# Patient Record
Sex: Female | Born: 2000 | Race: Black or African American | Hispanic: No | Marital: Single | Smoking: Never smoker
Health system: Southern US, Community
[De-identification: ages and names within clinical notes are randomized; demographics above are authoritative.]

---
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
## 2018-04-13 ENCOUNTER — Encounter: Payer: Self-pay | Admitting: Family Medicine

## 2018-04-13 ENCOUNTER — Ambulatory Visit (INDEPENDENT_AMBULATORY_CARE_PROVIDER_SITE_OTHER): Payer: BLUE CROSS/BLUE SHIELD | Admitting: Family Medicine

## 2018-04-13 ENCOUNTER — Other Ambulatory Visit: Payer: Self-pay

## 2018-04-13 VITALS — BP 122/82 | HR 62 | Temp 98.0°F | Resp 16 | Ht 72.44 in | Wt 227.0 lb

## 2018-04-13 DIAGNOSIS — R0981 Nasal congestion: Secondary | ICD-10-CM | POA: Diagnosis not present

## 2018-04-13 DIAGNOSIS — L700 Acne vulgaris: Secondary | ICD-10-CM | POA: Diagnosis not present

## 2018-04-13 DIAGNOSIS — R059 Cough, unspecified: Secondary | ICD-10-CM

## 2018-04-13 DIAGNOSIS — R05 Cough: Secondary | ICD-10-CM

## 2018-04-13 MED ORDER — DOXYCYCLINE HYCLATE 100 MG PO TABS: 100.0000 mg | ORAL_TABLET | Freq: Two times a day (BID) | ORAL | 0 refills | Status: DC

## 2018-04-13 NOTE — Patient Instructions (Addendum)
For cough and nasal congestion, start Flonase nasal spray and antibiotic twice per day.  That antibiotic will also help acne at times.  However I would like you to follow-up with your list of previous medications for acne so we can determine the next step.  See information below.  Let me know if you have any questions in the meantime, and if cough is not improving in the next 2 weeks with current treatment, please follow-up. Return to the clinic or go to the nearest emergency room if any of your symptoms worsen or new symptoms occur.   Acne Acne is a skin problem that causes pimples. Acne occurs when the pores in the skin get blocked. The pores may become infected with bacteria, or they may become red, sore, and swollen. Acne is a common skin problem, especially for teenagers. Acne usually goes away over time. What are the causes? Each pore contains an oil gland. Oil glands make an oily substance that is called sebum. Acne happens when these glands get plugged with sebum, dead skin cells, and dirt. Then, the bacteria that are normally found in the oil glands multiply and cause inflammation. Acne is commonly triggered by changes in your hormones. These hormonal changes can cause the oil glands to get bigger and to make more sebum. Factors that can make acne worse include:  Hormone changes during: ? Adolescence. ? Women's menstrual cycles. ? Pregnancy.  Oil-based cosmetics and hair products.  Harshly scrubbing the skin.  Strong soaps.  Stress.  Hormone problems that are due to certain diseases.  Long or oily hair rubbing against the skin.  Certain medicines.  Pressure from headbands, backpacks, or shoulder pads.  Exposure to certain oils and chemicals.  What increases the risk? This condition is more likely to develop in:  Teenagers.  People who have a family history of acne.  What are the signs or symptoms? Acne often occurs on the face, neck, chest, and upper back. Symptoms  include:  Small, red bumps (pimples or papules).  Whiteheads.  Blackheads.  Small, pus-filled pimples (pustules).  Big, red pimples or pustules that feel tender.  More severe acne can cause:  An infected area that contains a collection of pus (abscess).  Hard, painful, fluid-filled sacs (cysts).  Scars.  How is this diagnosed? This condition is diagnosed with a medical history and physical exam. Blood tests may also be done. How is this treated? Treatment for this condition can vary depending on the severity of your acne. Treatment may include:  Creams and lotions that prevent oil glands from clogging.  Creams and lotions that treat or prevent infections and inflammation.  Antibiotic medicines that are applied to the skin or taken as a pill.  Pills that decrease sebum production.  Birth control pills.  Light or laser treatments.  Surgery.  Injections of medicine into the affected areas.  Chemicals that cause peeling of the skin.  Your health care provider will also recommend the best way to take care of your skin. Good skin care is the most important part of treatment. Follow these instructions at home: Skin care Take care of your skin as told by your health care provider. You may be told to do these things:  Wash your skin gently at least two times each day, as well as: ? After you exercise. ? Before you go to bed.  Use mild soap.  Apply a water-based skin moisturizer after you wash your skin.  Use a sunscreen or sunblock with  SPF 29 or greater. This is especially important if you are using acne medicines.  Choose cosmetics that will not plug your oil glands (are noncomedogenic).  Medicines  Take over-the-counter and prescription medicines only as told by your health care provider.  If you were prescribed an antibiotic medicine, apply or take it as told by your health care provider. Do not stop taking the antibiotic even if your condition  improves. General instructions  Keep your hair clean and off of your face. If you have oily hair, shampoo your hair regularly or daily.  Avoid leaning your chin or forehead against your hands.  Avoid wearing tight headbands or hats.  Avoid picking or squeezing your pimples. That can make your acne worse and cause scarring.  Keep all follow-up visits as told by your health care provider. This is important.  Shave gently and only when necessary.  Keep a food journal to figure out if any foods are linked with your acne. Contact a health care provider if:  Your acne is not better after eight weeks.  Your acne gets worse.  You have a large area of skin that is red or tender.  You think that you are having side effects from any acne medicine. This information is not intended to replace advice given to you by your health care provider. Make sure you discuss any questions you have with your health care provider. Document Released: 12/09/2000 Document Revised: 08/12/2016 Document Reviewed: 02/18/2015 Elsevier Interactive Patient Education  2018 [place].  Cough, Adult Coughing is a reflex that clears your throat and your airways. Coughing helps to heal and protect your lungs. It is normal to cough occasionally, but a cough that happens with other symptoms or lasts a long time may be a sign of a condition that needs treatment. A cough may last only 2-3 weeks (acute), or it may last longer than 8 weeks (chronic). What are the causes? Coughing is commonly caused by:  Breathing in substances that irritate your lungs.  A viral or bacterial respiratory infection.  Allergies.  Asthma.  Postnasal drip.  Smoking.  Acid backing up from the stomach into the esophagus (gastroesophageal reflux).  Certain medicines.  Chronic lung problems, including COPD (or rarely, lung cancer).  Other medical conditions such as heart failure.  Follow these instructions at home: Pay attention to  any changes in your symptoms. Take these actions to help with your discomfort:  Take medicines only as told by your health care provider. ? If you were prescribed an antibiotic medicine, take it as told by your health care provider. Do not stop taking the antibiotic even if you start to feel better. ? Talk with your health care provider before you take a cough suppressant medicine.  Drink enough fluid to keep your urine clear or pale yellow.  If the air is dry, use a cold steam vaporizer or humidifier in your bedroom or your home to help loosen secretions.  Avoid anything that causes you to cough at work or at home.  If your cough is worse at night, try sleeping in a semi-upright position.  Avoid cigarette smoke. If you smoke, quit smoking. If you need help quitting, ask your health care provider.  Avoid caffeine.  Avoid alcohol.  Rest as needed.  Contact a health care provider if:  You have new symptoms.  You cough up pus.  Your cough does not get better after 2-3 weeks, or your cough gets worse.  You cannot control your cough  with suppressant medicines and you are losing sleep.  You develop pain that is getting worse or pain that is not controlled with pain medicines.  You have a fever.  You have unexplained weight loss.  You have night sweats. Get help right away if:  You cough up blood.  You have difficulty breathing.  Your heartbeat is very fast. This information is not intended to replace advice given to you by your health care provider. Make sure you discuss any questions you have with your health care provider. Document Released: 06/10/2011 Document Revised: 05/19/2016 Document Reviewed: 02/18/2015 Elsevier Interactive Patient Education  2018 [employer].   IF you received an x-ray today, you will receive an invoice from [facility]. Please contact [facility] at [phone] with questions or concerns regarding your invoice.   IF you  received labwork today, you will receive an invoice from [facility]. Please contact [facility] at [phone] with questions or concerns regarding your invoice.   Our billing staff will not be able to assist you with questions regarding bills from these companies.  You will be contacted with the lab results as soon as they are available. The fastest way to get your results is to activate your My Chart account. Instructions are located on the last page of this paperwork. If you have not heard from [place] regarding the results in 2 weeks, please contact this office.

## 2018-04-13 NOTE — Progress Notes (Signed)
Subjective:  By signing my name below, I, Carolyn Roman, attest that this documentation has been prepared under the direction and in the presence of Carolyn Ray, MD. Electronically Signed: Moises Roman, Rew. 04/13/2018 , 5:38 PM .  Patient was seen in Room 12 .   Patient ID: Carolyn Roman, female    DOB: 31-Oct-2001, 17 y.o.   MRN: [id number] Chief Complaint  Patient presents with  . Cough    x 3 weeks   . Acne    would like a cream to help with breakout   HPI Carolyn Roman is a 17 y.o. female  Cough Patient reports she's been having a persistent cough that started about 3 weeks ago. She felt hot to about 99*F, but no measured fevers over 100*F. She's coughed up some mucus, but no Roman. She describes her cough has been about the same, but keeps returning with some mucus. She denies wheezing or history of asthma. She's tried OTC cough syrup, cough drop, alka seltzer, and zinc- all without relief. In the beginning, she had some congestion about 2 weeks ago but this had resolved. She hasn't taking any allergy medications; no history or problems with allergy in the past. She denies history of smoking, although her roommates do vape.   Acne Patient requests information to help her acne. She reports trying multiple things in the past for acne. She recently started on an organic cream, but without relief. She's seen by dermatologist in the past, and was prescribed something that worked but stopped working; doesn't recall what it is. She informs areas affected mostly across her face, and some in her back and neck. She will bring in the medications and creams she's tried before.   Patient Active Problem List   Diagnosis Date Noted  . Left knee injury 07/09/2013  . Concussion 01/01/2013  . Forehead laceration 01/01/2013   History reviewed. No pertinent past medical history. History reviewed. No pertinent surgical history. No Known Allergies Prior to Admission medications     Medication Sig Start Date End Date Taking? Authorizing Provider  cyclobenzaprine (FLEXERIL) 5 MG tablet Take 1 tablet (5 mg total) by mouth 3 (three) times daily as needed for muscle spasms. 03/24/17   Drenda Freeze, MD  ibuprofen (ADVIL,MOTRIN) 800 MG tablet Take 1 tablet (800 mg total) by mouth 3 (three) times daily. 03/24/17   Drenda Freeze, MD   Social History   Socioeconomic History  . Marital status: Single    Spouse name: n/a  . Number of children: 0  . Years of education: Not on file  . Highest education level: Not on file  Occupational History  . Occupation: Ship broker  Social Needs  . Financial resource strain: Not on file  . Food insecurity:    Worry: Not on file    Inability: Not on file  . Transportation needs:    Medical: Not on file    Non-medical: Not on file  Tobacco Use  . Smoking status: Never Smoker  . Smokeless tobacco: Never Used  Substance and Sexual Activity  . Alcohol use: No  . Drug use: No  . Sexual activity: Never  Lifestyle  . Physical activity:    Days per week: Not on file    Minutes per session: Not on file  . Stress: Not on file  Relationships  . Social connections:    Talks on phone: Not on file    Gets together: Not on file    Attends religious service: Not  on file    Active member of club or organization: Not on file    Attends meetings of clubs or organizations: Not on file    Relationship status: Not on file  . Intimate partner violence:    Fear of current or ex partner: Not on file    Emotionally abused: Not on file    Physically abused: Not on file    Forced sexual activity: Not on file  Other Topics Concern  . Not on file  Social History Narrative   Student [employer].  Plays Basketball and [place].  Hopes to be a doctor in the orthopedic field.      Lives with mom.   Review of Systems  Constitutional: Negative for chills, fatigue, fever and unexpected weight change.  HENT: Negative for congestion.    Respiratory: Positive for cough.   Gastrointestinal: Negative for constipation, diarrhea, nausea and vomiting.  Skin: Positive for rash (facial acne). Negative for wound.  Neurological: Negative for dizziness, weakness and headaches.       Objective:   Physical Exam  Constitutional: She is oriented to person, place, and time. She appears well-developed and well-nourished. No distress.  HENT:  Head: Normocephalic and atraumatic.  Nose: Mucosal edema (bilateral) present. No rhinorrhea.  Mouth/Throat: Oropharynx is clear and moist.  Fine papular acne across her forehead and her cheeks  Eyes: Pupils are equal, round, and reactive to light. EOM are normal.  Neck: Neck supple.  Cardiovascular: Normal rate.  Pulmonary/Chest: Effort normal. No respiratory distress.  Musculoskeletal: Normal range of motion.  Neurological: She is alert and oriented to person, place, and time.  Skin: Skin is warm and dry.  Psychiatric: She has a normal mood and affect. Her behavior is normal.  Nursing note and vitals reviewed.   Vitals:   04/13/18 1708  BP: 122/82  Pulse: 62  Resp: 16  Temp: 98 F (36.7 C)  TempSrc: Oral  SpO2: 99%  Weight: 227 lb (103 kg)  Height: 6' 0.44" (1.84 m)       Assessment & Plan:    [name] is a 17 y.o. female Acne vulgaris  -Reports multiple prior different treatments, some that had lost effectiveness, but she is unsure of exact names.  Asked that she return with the list of different medications so we can most appropriately go through different options.  Can also discuss with dermatology where she was seen previously if she prefers.  Handout on acne was given.  Cough - Plan: doxycycline (VIBRA-TABS) 100 MG tablet Nasal congestion  -Persistent cough, suspect bronchitis/lower respiratory tract infection.  Start doxycycline, Flonase nasal spray for allergies, other symptomatic care and RTC precautions.  Meds ordered this encounter  Medications  .  doxycycline (VIBRA-TABS) 100 MG tablet    Sig: Take 1 tablet (100 mg total) by mouth 2 (two) times daily.    Dispense:  20 tablet    Refill:  0   Patient Instructions   For cough and nasal congestion, start Flonase nasal spray and antibiotic twice per day.  That antibiotic will also help acne at times.  However I would like you to follow-up with your list of previous medications for acne so we can determine the next step.  See information below.  Let me know if you have any questions in the meantime, and if cough is not improving in the next 2 weeks with current treatment, please follow-up. Return to the clinic or go to the nearest emergency room if any of  your symptoms worsen or new symptoms occur.   Acne Acne is a skin problem that causes pimples. Acne occurs when the pores in the skin get blocked. The pores may become infected with bacteria, or they may become red, sore, and swollen. Acne is a common skin problem, especially for teenagers. Acne usually goes away over time. What are the causes? Each pore contains an oil gland. Oil glands make an oily substance that is called sebum. Acne happens when these glands get plugged with sebum, dead skin cells, and dirt. Then, the bacteria that are normally found in the oil glands multiply and cause inflammation. Acne is commonly triggered by changes in your hormones. These hormonal changes can cause the oil glands to get bigger and to make more sebum. Factors that can make acne worse include:  Hormone changes during: ? Adolescence. ? Women's menstrual cycles. ? Pregnancy.  Oil-based cosmetics and hair products.  Harshly scrubbing the skin.  Strong soaps.  Stress.  Hormone problems that are due to certain diseases.  Long or oily hair rubbing against the skin.  Certain medicines.  Pressure from headbands, backpacks, or shoulder pads.  Exposure to certain oils and chemicals.  What increases the risk? This condition is more likely to  develop in:  Teenagers.  People who have a family history of acne.  What are the signs or symptoms? Acne often occurs on the face, neck, chest, and upper back. Symptoms include:  Small, red bumps (pimples or papules).  Whiteheads.  Blackheads.  Small, pus-filled pimples (pustules).  Big, red pimples or pustules that feel tender.  More severe acne can cause:  An infected area that contains a collection of pus (abscess).  Hard, painful, fluid-filled sacs (cysts).  Scars.  How is this diagnosed? This condition is diagnosed with a medical history and physical exam. Roman tests may also be done. How is this treated? Treatment for this condition can vary depending on the severity of your acne. Treatment may include:  Creams and lotions that prevent oil glands from clogging.  Creams and lotions that treat or prevent infections and inflammation.  Antibiotic medicines that are applied to the skin or taken as a pill.  Pills that decrease sebum production.  Birth control pills.  Light or laser treatments.  Surgery.  Injections of medicine into the affected areas.  Chemicals that cause peeling of the skin.  Your health care provider will also recommend the best way to take care of your skin. Good skin care is the most important part of treatment. Follow these instructions at home: Skin care Take care of your skin as told by your health care provider. You may be told to do these things:  Wash your skin gently at least two times each day, as well as: ? After you exercise. ? Before you go to bed.  Use mild soap.  Apply a water-based skin moisturizer after you wash your skin.  Use a sunscreen or sunblock with SPF 30 or greater. This is especially important if you are using acne medicines.  Choose cosmetics that will not plug your oil glands (are noncomedogenic).  Medicines  Take over-the-counter and prescription medicines only as told by your health care  provider.  If you were prescribed an antibiotic medicine, apply or take it as told by your health care provider. Do not stop taking the antibiotic even if your condition improves. General instructions  Keep your hair clean and off of your face. If you have oily hair, shampoo your  hair regularly or daily.  Avoid leaning your chin or forehead against your hands.  Avoid wearing tight headbands or hats.  Avoid picking or squeezing your pimples. That can make your acne worse and cause scarring.  Keep all follow-up visits as told by your health care provider. This is important.  Shave gently and only when necessary.  Keep a food journal to figure out if any foods are linked with your acne. Contact a health care provider if:  Your acne is not better after eight weeks.  Your acne gets worse.  You have a large area of skin that is red or tender.  You think that you are having side effects from any acne medicine. This information is not intended to replace advice given to you by your health care provider. Make sure you discuss any questions you have with your health care provider. Document Released: 12/09/2000 Document Revised: 08/12/2016 Document Reviewed: 02/18/2015 Elsevier Interactive Patient Education  2018 [employer].  Cough, Adult Coughing is a reflex that clears your throat and your airways. Coughing helps to heal and protect your lungs. It is normal to cough occasionally, but a cough that happens with other symptoms or lasts a long time may be a sign of a condition that needs treatment. A cough may last only 2-3 weeks (acute), or it may last longer than 8 weeks (chronic). What are the causes? Coughing is commonly caused by:  Breathing in substances that irritate your lungs.  A viral or bacterial respiratory infection.  Allergies.  Asthma.  Postnasal drip.  Smoking.  Acid backing up from the stomach into the esophagus (gastroesophageal reflux).  Certain  medicines.  Chronic lung problems, including COPD (or rarely, lung cancer).  Other medical conditions such as heart failure.  Follow these instructions at home: Pay attention to any changes in your symptoms. Take these actions to help with your discomfort:  Take medicines only as told by your health care provider. ? If you were prescribed an antibiotic medicine, take it as told by your health care provider. Do not stop taking the antibiotic even if you start to feel better. ? Talk with your health care provider before you take a cough suppressant medicine.  Drink enough fluid to keep your urine clear or pale yellow.  If the air is dry, use a cold steam vaporizer or humidifier in your bedroom or your home to help loosen secretions.  Avoid anything that causes you to cough at work or at home.  If your cough is worse at night, try sleeping in a semi-upright position.  Avoid cigarette smoke. If you smoke, quit smoking. If you need help quitting, ask your health care provider.  Avoid caffeine.  Avoid alcohol.  Rest as needed.  Contact a health care provider if:  You have new symptoms.  You cough up pus.  Your cough does not get better after 2-3 weeks, or your cough gets worse.  You cannot control your cough with suppressant medicines and you are losing sleep.  You develop pain that is getting worse or pain that is not controlled with pain medicines.  You have a fever.  You have unexplained weight loss.  You have night sweats. Get help right away if:  You cough up Roman.  You have difficulty breathing.  Your heartbeat is very fast. This information is not intended to replace advice given to you by your health care provider. Make sure you discuss any questions you have with your health care provider. Document  Released: 06/10/2011 Document Revised: 05/19/2016 Document Reviewed: 02/18/2015 Elsevier Interactive Patient Education  2018 [employer].   IF you received  an x-Roman today, you will receive an invoice from [facility]. Please contact [facility] at [phone] with questions or concerns regarding your invoice.   IF you received labwork today, you will receive an invoice from [facility]. Please contact [facility] at [phone] with questions or concerns regarding your invoice.   Our billing staff will not be able to assist you with questions regarding bills from these companies.  You will be contacted with the lab results as soon as they are available. The fastest way to get your results is to activate your My Chart account. Instructions are located on the last page of this paperwork. If you have not heard from [place] regarding the results in 2 weeks, please contact this office.        I personally performed the services described in this documentation, which was scribed in my presence. The recorded information has been reviewed and considered for accuracy and completeness, addended by me as needed, and agree with information above.  Signed,   Carolyn Ray, MD [facility].  04/15/18 4:08 PM

## 2018-06-07 IMAGING — DX DG CERVICAL SPINE COMPLETE 4+V
5 series · 5 of 5 positions shown · non-contrast
Comparison: None.

CLINICAL DATA: Neck pain and left arm numbness after stretching
today

EXAM:
CERVICAL SPINE - COMPLETE 4+ VIEW

[w cervical spine lat]
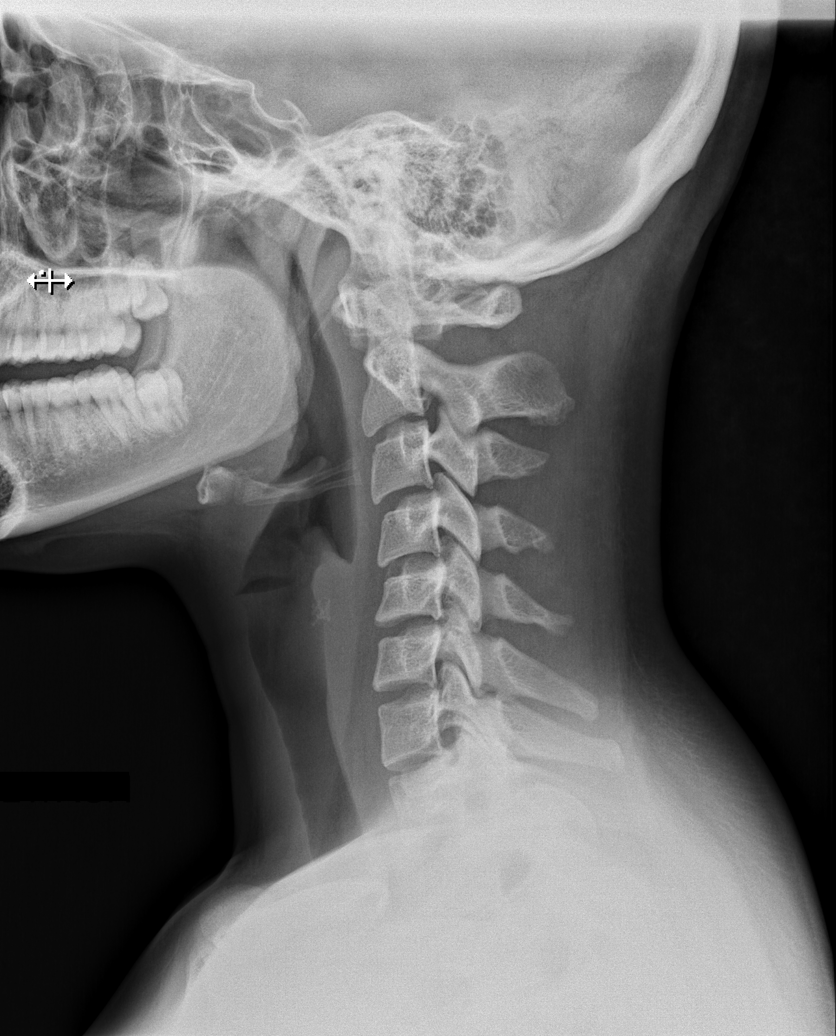

[w cervical spine ap_obl (1 of 2)]
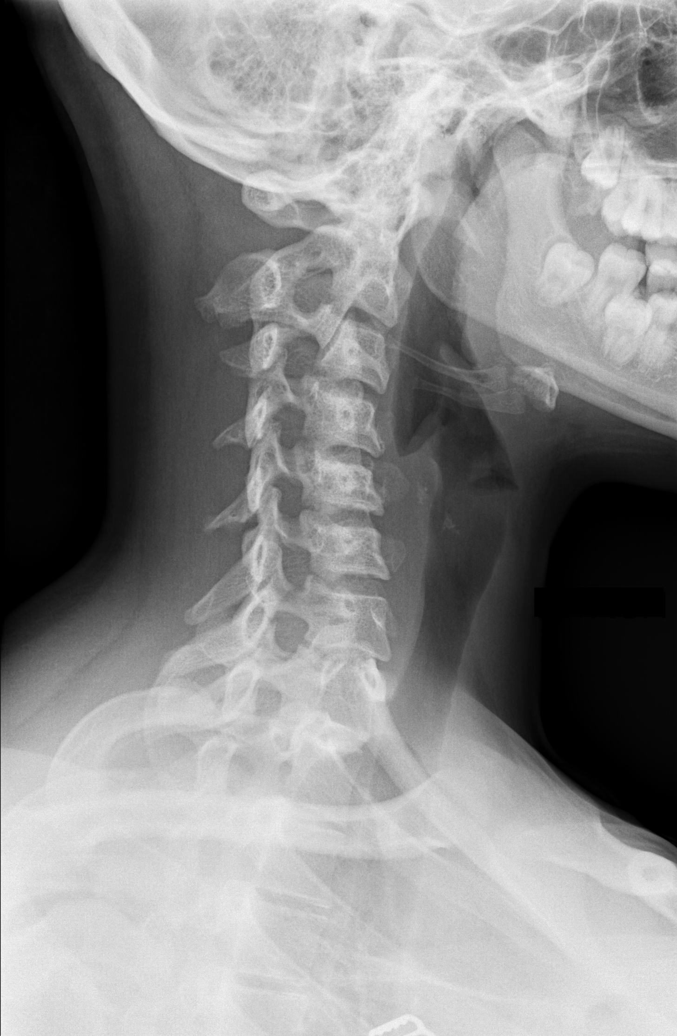

[w cervical spine ap_obl (2 of 2)]
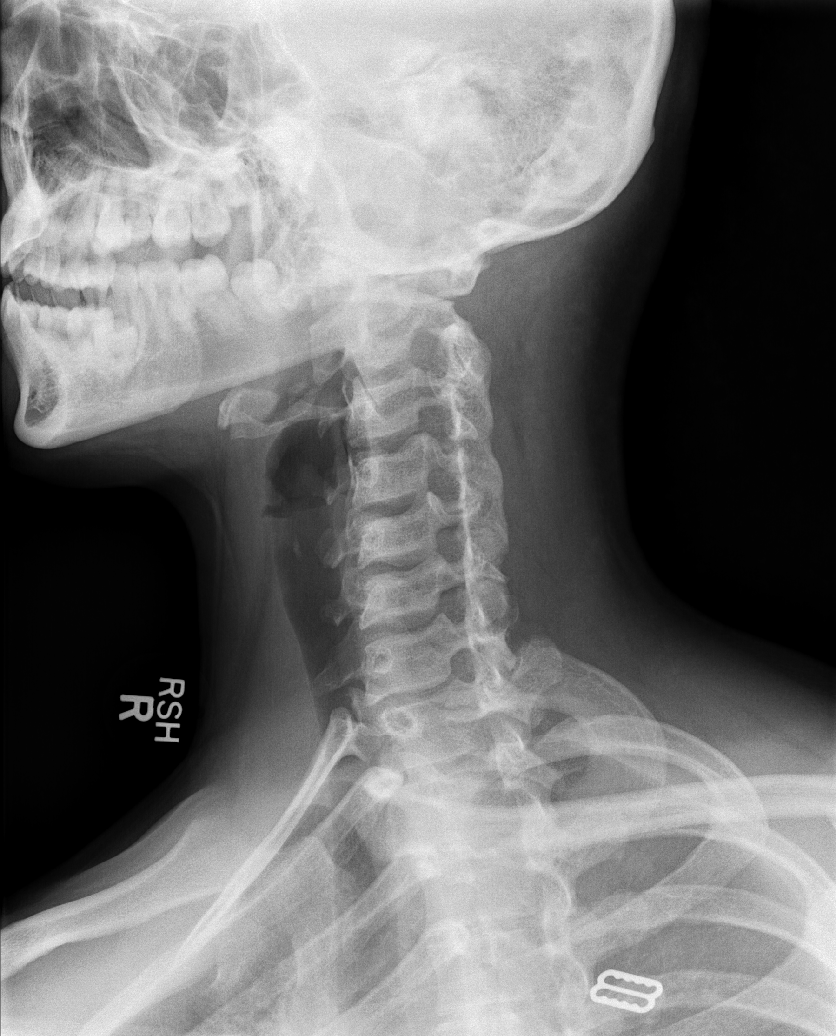

[w cervical spine ap]
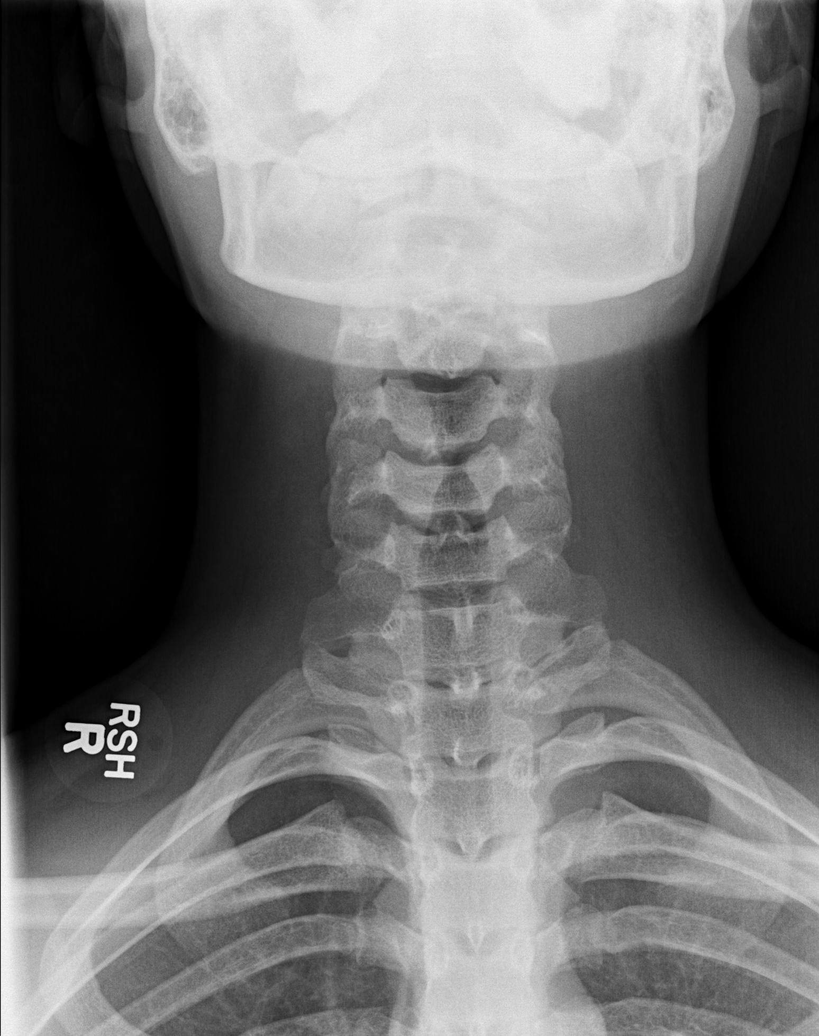

[w cervical spine odontoid]
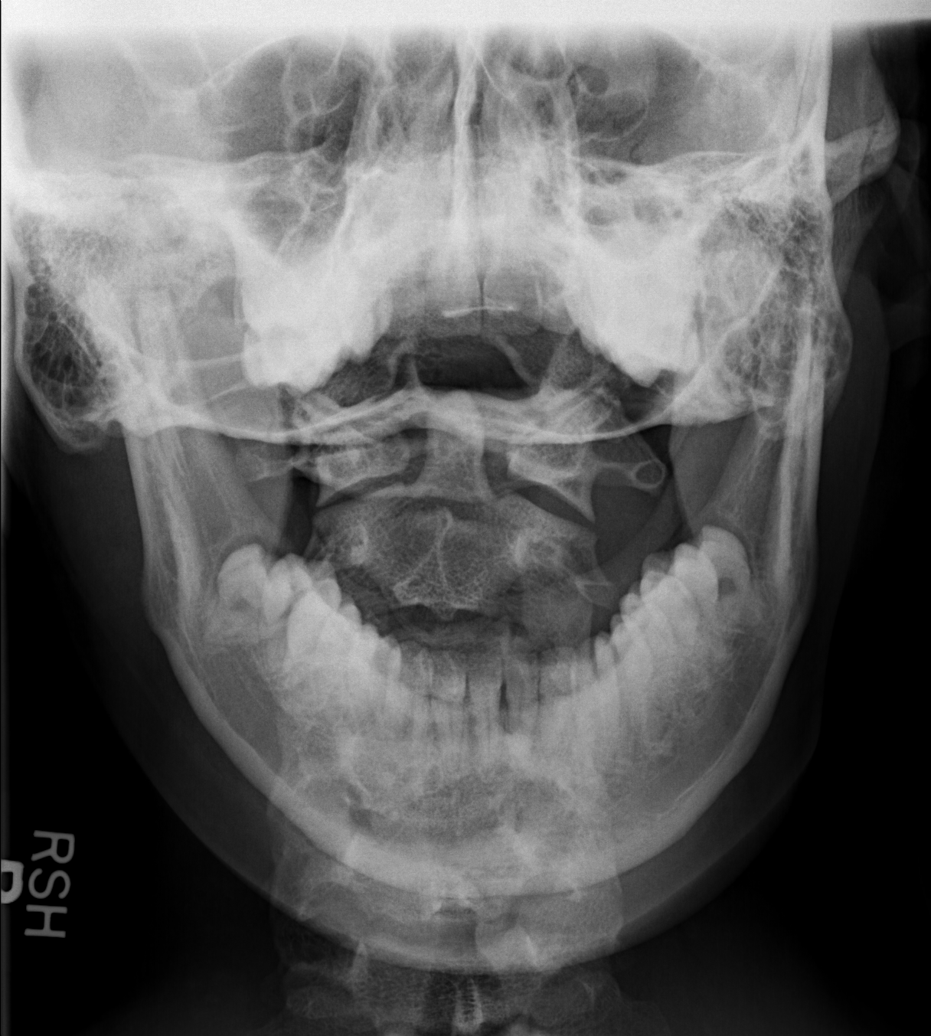

[5 of 5 positions shown; findings below may reference images not displayed]

FINDINGS: There is no evidence of cervical spine fracture or prevertebral soft
tissue swelling. Alignment is normal. No other significant bone
abnormalities are identified.
IMPRESSION: Negative cervical spine radiographs.

## 2018-07-20 ENCOUNTER — Ambulatory Visit (INDEPENDENT_AMBULATORY_CARE_PROVIDER_SITE_OTHER): Payer: BLUE CROSS/BLUE SHIELD | Admitting: Family Medicine

## 2018-07-20 ENCOUNTER — Encounter: Payer: Self-pay | Admitting: Family Medicine

## 2018-07-20 VITALS — BP 128/86 | HR 70 | Temp 98.3°F | Wt 227.0 lb

## 2018-07-20 DIAGNOSIS — L700 Acne vulgaris: Secondary | ICD-10-CM

## 2018-07-20 DIAGNOSIS — Z7689 Persons encountering health services in other specified circumstances: Secondary | ICD-10-CM

## 2018-07-20 NOTE — Progress Notes (Signed)
Patient presents to clinic today to establish care.  SUBJECTIVE: PMH: Patient is an 17 year old female with past medical history significant for acne.  Patient was previously seen at [facility].  Pt is unsure when she had a physical.  Acne: -Patient has tried Hibiclens, dapsone, and another cream. -The above medicine started working been stopped -Patient seen dermatology in the past. -States her acne is worse during menses and leaves dark scars. -Currently using water only to clean her face. -Drinking 8 more of water bottles/day -Not eating chocolate and greasy foods.  Environmental allergies: -Patient endorses allergy symptoms while living in [place]. -Since being back in [place] her symptoms have resolved.  Allergies: NKDA  Past surgical history: Left knee patellofemoral tendon repair  Social history: Patient is single.  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  Patient denies alcohol, tobacco, drug use.    Health Maintenance: Dental --Baird Lyons, DDS Vision --[place] [facility] Immunizations --TB test 2018, influenza 2019 PAP -- n/a LMP 7/11-7/14/19  History reviewed. No pertinent past medical history.  Past Surgical History:  Procedure Laterality Date  . LIGAMENT REPAIR Left 2017    No current outpatient medications on file prior to visit.   No current facility-administered medications on file prior to visit.     No Known Allergies  Family History  Adopted: Yes    Social History   Socioeconomic History  . Marital status: Single    Spouse name: n/a  . Number of children: 0  . Years of education: Not on file  . Highest education level: Not on file  Occupational History  . Occupation: Ship broker  Social Needs  . Financial resource strain: Not on file  . Food insecurity:    Worry: Not on file    Inability: Not on file  . Transportation needs:    Medical: Not on  file    Non-medical: Not on file  Tobacco Use  . Smoking status: Never Smoker  . Smokeless tobacco: Never Used  Substance and Sexual Activity  . Alcohol use: No  . Drug use: No  . Sexual activity: Never  Lifestyle  . Physical activity:    Days per week: Not on file    Minutes per session: Not on file  . Stress: Not on file  Relationships  . Social connections:    Talks on phone: Not on file    Gets together: Not on file    Attends religious service: Not on file    Active member of club or organization: Not on file    Attends meetings of clubs or organizations: Not on file    Relationship status: Not on file  . Intimate partner violence:    Fear of current or ex partner: Not on file    Emotionally abused: Not on file    Physically abused: Not on file    Forced sexual activity: Not on file  Other Topics Concern  . Not on file  Social History Narrative   Student [employer].  Plays Basketball and [employer].  Hopes to be a doctor in the orthopedic field.      Lives with mom.    ROS General: Denies fever, chills, night sweats, changes in weight, changes in appetite HEENT: Denies headaches, ear pain, changes in vision, rhinorrhea, sore throat CV: Denies CP, palpitations, SOB, orthopnea Pulm: Denies SOB, cough, wheezing GI: Denies abdominal pain, nausea, vomiting, diarrhea, constipation GU: Denies  dysuria, hematuria, frequency, vaginal discharge Msk: Denies muscle cramps, joint pains Neuro: Denies weakness, numbness, tingling Skin: Denies rashes, bruising  +acne Psych: Denies depression, anxiety, hallucinations  BP 128/86 (BP Location: Left Arm, Patient Position: Sitting, Cuff Size: Normal)   Pulse 70   Temp 98.3 F (36.8 C) (Oral)   Wt 227 lb (103 kg)   LMP 07/08/2018 (Exact Date)   SpO2 99%   BMI 30.41 kg/m   Physical Exam Gen. Pleasant, well developed, well-nourished, in NAD HEENT - acne on face, [place]/AT, PERRL, no scleral icterus, no nasal drainage,  pharynx without erythema or exudate. Lungs: no use of accessory muscles, CTAB, no wheezes, rales or rhonchi Cardiovascular: RRR, No r/g/m, no peripheral edema Abdomen: BS present, soft, nontender, nondistended, Neuro:  A&Ox3, CN II-XII intact, normal gait Skin:  Warm, dry, intact.  A few pustules on face with hyperpigmentation and cystic lesions.  No results found for this or any previous visit (from the past 2160 hour(s)).  Assessment/Plan: Acne vulgaris -moderate acne -Discussed various prescription treatment options -Patient encouraged to clean her face twice a day with a gentle cleanser such as set of fill. -Offered referral to dermatology.  Pt wishes to wait on this.  Encounter to establish  -We reviewed the PMH, PSH, FH, SH, Meds and Allergies. -We provided refills for any medications we will prescribe as needed. -We addressed current concerns per orders and patient instructions. -We have asked for records for pertinent exams, studies, vaccines and notes from previous providers. -We have advised patient to follow up per instructions below.  F/u prn  Grier Mitts, MD

## 2018-08-09 ENCOUNTER — Ambulatory Visit (INDEPENDENT_AMBULATORY_CARE_PROVIDER_SITE_OTHER): Payer: BLUE CROSS/BLUE SHIELD | Admitting: Family Medicine

## 2018-08-09 ENCOUNTER — Encounter: Payer: Self-pay | Admitting: Family Medicine

## 2018-08-09 VITALS — BP 110/82 | HR 72 | Temp 98.3°F | Ht 73.0 in | Wt 226.0 lb

## 2018-08-09 DIAGNOSIS — L700 Acne vulgaris: Secondary | ICD-10-CM | POA: Diagnosis not present

## 2018-08-09 DIAGNOSIS — Z0000 Encounter for general adult medical examination without abnormal findings: Secondary | ICD-10-CM | POA: Diagnosis not present

## 2018-08-09 NOTE — Patient Instructions (Signed)
Preventive Care 18-39 Years, Female Preventive care refers to lifestyle choices and visits with your health care provider that can promote health and wellness. What does preventive care include?  A yearly physical exam. This is also called an annual well check.  Dental exams once or twice a year.  Routine eye exams. Ask your health care provider how often you should have your eyes checked.  Personal lifestyle choices, including: ? Daily care of your teeth and gums. ? Regular physical activity. ? Eating a healthy diet. ? Avoiding tobacco and drug use. ? Limiting alcohol use. ? Practicing safe sex. ? Taking vitamin and mineral supplements as recommended by your health care provider. What happens during an annual well check? The services and screenings done by your health care provider during your annual well check will depend on your age, overall health, lifestyle risk factors, and family history of disease. Counseling Your health care provider may ask you questions about your:  Alcohol use.  Tobacco use.  Drug use.  Emotional well-being.  Home and relationship well-being.  Sexual activity.  Eating habits.  Work and work Statistician.  Method of birth control.  Menstrual cycle.  Pregnancy history.  Screening You may have the following tests or measurements:  Height, weight, and BMI.  Diabetes screening. This is done by checking your blood sugar (glucose) after you have not eaten for a while (fasting).  Blood pressure.  Lipid and cholesterol levels. These may be checked every 5 years starting at age 66.  Skin check.  Hepatitis C blood test.  Hepatitis B blood test.  Sexually transmitted disease (STD) testing.  BRCA-related cancer screening. This may be done if you have a family history of breast, ovarian, tubal, or peritoneal cancers.  Pelvic exam and Pap test. This may be done every 3 years starting at age 40. Starting at age 59, this may be done every 5  years if you have a Pap test in combination with an HPV test.  Discuss your test results, treatment options, and if necessary, the need for more tests with your health care provider. Vaccines Your health care provider may recommend certain vaccines, such as:  Influenza vaccine. This is recommended every year.  Tetanus, diphtheria, and acellular pertussis (Tdap, Td) vaccine. You may need a Td booster every 10 years.  Varicella vaccine. You may need this if you have not been vaccinated.  HPV vaccine. If you are 69 or younger, you may need three doses over 6 months.  Measles, mumps, and rubella (MMR) vaccine. You may need at least one dose of MMR. You may also need a second dose.  Pneumococcal 13-valent conjugate (PCV13) vaccine. You may need this if you have certain conditions and were not previously vaccinated.  Pneumococcal polysaccharide (PPSV23) vaccine. You may need one or two doses if you smoke cigarettes or if you have certain conditions.  Meningococcal vaccine. One dose is recommended if you are age 27-21 years and a first-year college student living in a residence hall, or if you have one of several medical conditions. You may also need additional booster doses.  Hepatitis A vaccine. You may need this if you have certain conditions or if you travel or work in places where you may be exposed to hepatitis A.  Hepatitis B vaccine. You may need this if you have certain conditions or if you travel or work in places where you may be exposed to hepatitis B.  Haemophilus influenzae type b (Hib) vaccine. You may need this if  you have certain risk factors.  Talk to your health care provider about which screenings and vaccines you need and how often you need them. This information is not intended to replace advice given to you by your health care provider. Make sure you discuss any questions you have with your health care provider. Document Released: 02/07/2002 Document Revised: 08/31/2016  Document Reviewed: 10/13/2015 Elsevier Interactive Patient Education  [employer].

## 2018-08-09 NOTE — Progress Notes (Signed)
Subjective:     Carolyn Roman is a 17 y.o. female and is here for a comprehensive physical exam. The patient reports no problems.  Pt states she has noticed improvement in her skin since using cetaphil cleanser.  Pt is also trying to keep her hair off of her face.  Pt is excited about starting school at [employer].  She moved some things to [facility] and moves the rest on Sunday.  Pt is thinking about trying out for the volleyball team.  Requesting physical form so she can take with her.  Social History   Socioeconomic History  . Marital status: Single    Spouse name: n/a  . Number of children: 0  . Years of education: Not on file  . Highest education level: Not on file  Occupational History  . Occupation: Ship broker  Social Needs  . Financial resource strain: Not on file  . Food insecurity:    Worry: Not on file    Inability: Not on file  . Transportation needs:    Medical: Not on file    Non-medical: Not on file  Tobacco Use  . Smoking status: Never Smoker  . Smokeless tobacco: Never Used  Substance and Sexual Activity  . Alcohol use: No  . Drug use: No  . Sexual activity: Never  Lifestyle  . Physical activity:    Days per week: Not on file    Minutes per session: Not on file  . Stress: Not on file  Relationships  . Social connections:    Talks on phone: Not on file    Gets together: Not on file    Attends religious service: Not on file    Active member of club or organization: Not on file    Attends meetings of clubs or organizations: Not on file    Relationship status: Not on file  . Intimate partner violence:    Fear of current or ex partner: Not on file    Emotionally abused: Not on file    Physically abused: Not on file    Forced sexual activity: Not on file  Other Topics Concern  . Not on file  Social History Narrative   Student [employer].  Plays Basketball and [employer].  Hopes to be a doctor in the orthopedic field.      Lives with mom.    Health Maintenance  Topic Date Due  . HIV Screening  09/25/2014  . INFLUENZA VACCINE  07/26/2018    The following portions of the patient's history were reviewed and updated as appropriate: allergies, current medications, past family history, past medical history, past social history, past surgical history and problem list.  Review of Systems A comprehensive review of systems was negative.   Objective:    BP 110/82 (BP Location: Left Arm, Patient Position: Sitting, Cuff Size: Large)   Pulse 72   Temp 98.3 F (36.8 C) (Oral)   Ht 6\' 1"  (1.854 m)   Wt 226 lb (102.5 kg)   LMP 08/02/2018 (Exact Date)   SpO2 98%   BMI 29.82 kg/m   Hearing passed b/l ears.  General appearance: alert, cooperative and no distress Head: Normocephalic, without obvious abnormality, atraumatic Eyes: conjunctivae/corneas clear. PERRL, EOM's intact. Fundi benign. Ears: normal TM's and external ear canals both ears Nose: Nares normal. Septum midline. Mucosa normal. No drainage or sinus tenderness. Throat: lips, mucosa, and tongue normal; teeth and gums normal Neck: no adenopathy, no JVD, supple, symmetrical, trachea midline and thyroid not enlarged,  symmetric, no tenderness/mass/nodules Lungs: clear to auscultation bilaterally Heart: regular rate and rhythm, S1, S2 normal, no murmur, click, rub or gallop Abdomen: soft, non-tender; bowel sounds normal; no masses,  no organomegaly Extremities: extremities normal, atraumatic, no cyanosis or edema Skin: Skin color, texture, turgor normal. No rashes.  Several hyperpigmented areas on b/l checks and a few pustules noted. Neurologic: Alert and oriented X 3, normal strength and tone. Normal symmetric reflexes. Normal coordination and gait    Assessment:    Healthy female exam.      Plan:     Anticipatory guidance given including wearing seatbelts, smoke detectors in the home, increasing physical activity, increasing p.o. intake of water and  vegetables. -immunizations up to date.  Influenza vaccine recommended -Handout given -Sports participation form completed/note given to patient stating she is able to participate in sports. See After Visit Summary for Counseling Recommendations    Acne -continue using cetaphil cleanser BID, drinking plenty of water, and keeping your hair off of your face.  F/u prn  Grier Mitts, MD

## 2018-12-07 ENCOUNTER — Ambulatory Visit: Payer: BLUE CROSS/BLUE SHIELD | Admitting: Family Medicine

## 2018-12-11 ENCOUNTER — Encounter: Payer: Self-pay | Admitting: Family Medicine

## 2018-12-11 ENCOUNTER — Ambulatory Visit (INDEPENDENT_AMBULATORY_CARE_PROVIDER_SITE_OTHER): Payer: BLUE CROSS/BLUE SHIELD | Admitting: Family Medicine

## 2018-12-11 VITALS — BP 110/78 | HR 68 | Temp 98.9°F | Wt 224.0 lb

## 2018-12-11 DIAGNOSIS — J302 Other seasonal allergic rhinitis: Secondary | ICD-10-CM | POA: Diagnosis not present

## 2018-12-11 NOTE — Patient Instructions (Addendum)
You can try any of the over-the-counter allergy medications for your symptoms.  These medications include Allegra, Zyrtec, Claritin, Xyzal.  There are also allergy nasal sprays such as Flonase, Nasacort, or saline nasal spray that you can use.  Allergies, Adult An allergy is when your body's defense system (immune system) overreacts to an otherwise harmless substance (allergen) that you breathe in or eat or something that touches your skin. When you come into contact with something that you are allergic to, your immune system produces certain proteins (antibodies). These proteins cause cells to release chemicals (histamines) that trigger the symptoms of an allergic reaction. Allergies often affect the nasal passages (allergic rhinitis), eyes (allergic conjunctivitis), skin (atopic dermatitis), and stomach. Allergies can be mild or severe. Allergies cannot spread from person to person (are not contagious). They can develop at any age and may be outgrown. What increases the risk? You may be at greater risk of allergies if other people in your family have allergies. What are the signs or symptoms? Symptoms depend on what type of allergy you have. They may include:  Runny, stuffy nose.  Sneezing.  Itchy mouth, ears, or throat.  Postnasal drip.  Sore throat.  Itchy, red, watery, or puffy eyes.  Skin rash or hives.  Stomach pain.  Vomiting.  Diarrhea.  Bloating.  Wheezing or coughing.  People with a severe allergy to food, medicine, or an insect bite may have a life-threatening allergic reaction (anaphylaxis). Symptoms of anaphylaxis include:  Hives.  Itching.  Flushed face.  Swollen lips, tongue, or mouth.  Tight or swollen throat.  Chest pain or tightness in the chest.  Trouble breathing or shortness of breath.  Rapid heartbeat.  Dizziness or fainting.  Vomiting.  Diarrhea.  Pain in the abdomen.  How is this diagnosed? This condition is diagnosed based  on:  Your symptoms.  Your family and medical history.  A physical exam.  You may need to see a health care provider who specializes in treating allergies (allergist). You may also have tests, including:  Skin tests to see which allergens are causing your symptoms, such as: ? Skin prick test. In this test, your skin is pricked with a tiny needle and exposed to small amounts of possible allergens to see if your skin reacts. ? Intradermal skin test. In this test, a small amount of allergen is injected under your skin to see if your skin reacts. ? Patch test. In this test, a small amount of allergen is placed on your skin and then your skin is covered with a bandage. Your health care provider will check your skin after a couple of days to see if a rash has developed.  Blood tests.  Challenges tests. In this test, you inhale a small amount of allergen by mouth to see if you have an allergic reaction.  You may also be asked to:  Keep a food diary. A food diary is a record of all the foods and drinks you have in a day and any symptoms you experience.  Practice an elimination diet. An elimination diet involves eliminating specific foods from your diet and then adding them back in one by one to find out if a certain food causes an allergic reaction.  How is this treated? Treatment for allergies depends on your symptoms. Treatment may include:  Cold compresses to soothe itching and swelling.  Eye drops.  Nasal sprays.  Using a saline spray or container (neti pot) to flush out the nose (nasal irrigation). These  methods can help clear away mucus and keep the nasal passages moist.  Using a humidifier.  Oral antihistamines or other medicines to block allergic reaction and inflammation.  Skin creams to treat rashes or itching.  Diet changes to eliminate food allergy triggers.  Repeated exposure to tiny amounts of allergens to build up a tolerance and prevent future allergic reactions  (immunotherapy). These include: ? Allergy shots. ? Oral treatment. This involves taking small doses of an allergen under the tongue (sublingual immunotherapy).  Emergency epinephrine injection (auto-injector) in case of an allergic emergency. This is a self-injectable, pre-measured medicine that must be given within the first few minutes of a serious allergic reaction.  Follow these instructions at home:  Avoid known allergens whenever possible.  If you suffer from airborne allergens, wash out your nose daily. You can do this with a saline spray or a neti pot to flush out your nose (nasal irrigation).  Take over-the-counter and prescription medicines only as told by your health care provider.  Keep all follow-up visits as told by your health care provider. This is important.  If you are at risk of a severe allergic reaction (anaphylaxis), keep your auto-injector with you at all times.  If you have ever had anaphylaxis, wear a medical alert bracelet or necklace that states you have a severe allergy. Contact a health care provider if:  Your symptoms do not improve with treatment. Get help right away if:  You have symptoms of anaphylaxis, such as: ? Swollen mouth, tongue, or throat. ? Pain or tightness in your chest. ? Trouble breathing or shortness of breath. ? Dizziness or fainting. ? Severe abdominal pain, vomiting, or diarrhea. This information is not intended to replace advice given to you by your health care provider. Make sure you discuss any questions you have with your health care provider. Document Released: 03/07/2003 Document Revised: 04/12/2017 Document Reviewed: 06/29/2016 Elsevier Interactive Patient Education  2018 Section Drip Postnasal drip is the feeling of mucus going down the back of your throat. Mucus is a slimy substance that moistens and cleans your nose and throat, as well as the air pockets in face bones near your forehead and cheeks  (sinuses). Small amounts of mucus pass from your nose and sinuses down the back of your throat all the time. This is normal. When you produce too much mucus or the mucus gets too thick, you can feel it. Some common causes of postnasal drip include:  Having more mucus because of: ? A cold or the flu. ? Allergies. ? Cold air. ? Certain medicines.  Having more mucus that is thicker because of: ? A sinus or nasal infection. ? Dry air. ? A food allergy.  Follow these instructions at home: Relieving discomfort  Gargle with a salt-water mixture 3-4 times a day or as needed. To make a salt-water mixture, completely dissolve -1 tsp of salt in 1 cup of warm water.  If the air in your home is dry, use a humidifier to add moisture to the air.  Use a saline spray or container (neti pot) to flush out the nose (nasal irrigation). These methods can help clear away mucus and keep the nasal passages moist. General instructions  Take over-the-counter and prescription medicines only as told by your health care provider.  Follow instructions from your health care provider about eating or drinking restrictions. You may need to avoid caffeine.  Avoid things that you know you are allergic to (allergens), like dust, mold,  pollen, pets, or certain foods.  Drink enough fluid to keep your urine pale yellow.  Keep all follow-up visits as told by your health care provider. This is important. Contact a health care provider if:  You have a fever.  You have a sore throat.  You have difficulty swallowing.  You have headache.  You have sinus pain.  You have a cough that does not go away.  The mucus from your nose becomes thick and is green or yellow in color.  You have cold or flu symptoms that last more than 10 days. Summary  Postnasal drip is the feeling of mucus going down the back of your throat.  If your health care provider approves, use nasal irrigation or a nasal spray 2?4 times a  day.  Avoid things that you know you are allergic to (allergens), like dust, mold, pollen, pets, or certain foods. This information is not intended to replace advice given to you by your health care provider. Make sure you discuss any questions you have with your health care provider. Document Released: 03/27/2017 Document Revised: 03/27/2017 Document Reviewed: 03/27/2017 Elsevier Interactive Patient Education  [employer].

## 2018-12-11 NOTE — Progress Notes (Signed)
Subjective:    Patient ID: Carolyn Roman, female    DOB: November 27, 2001, 17 y.o.   MRN: [id number]  No chief complaint on file.   HPI Patient was seen today for ongoing concern.  Pt endorses cough x2 months.  At times the cough is productive.  Pt notes increased coughing before bed and when it is cold outside.  Pt denies sore throat, stuffy nose, acid taste in mouth, heartburn.  Pt has tried Alka-Seltzer, Robitussin, Mucinex, tea, honey, Tessalon Perles for her symptoms.  Pt was seen by campus provider who thought her symptoms were 2/2 reflux.  Of note patient finished the semester with a 4.0 GPA.  She is a Ship broker at [employer].  History reviewed. No pertinent past medical history.  No Known Allergies  ROS General: Denies fever, chills, night sweats, changes in weight, changes in appetite HEENT: Denies headaches, ear pain, changes in vision, rhinorrhea, sore throat CV: Denies CP, palpitations, SOB, orthopnea Pulm: Denies SOB, wheezing  +cough GI: Denies abdominal pain, nausea, vomiting, diarrhea, constipation GU: Denies dysuria, hematuria, frequency, vaginal discharge Msk: Denies muscle cramps, joint pains Neuro: Denies weakness, numbness, tingling Skin: Denies rashes, bruising Psych: Denies depression, anxiety, hallucinations    Objective:    Blood pressure 110/78, pulse 68, temperature 98.9 F (37.2 C), temperature source Oral, weight 224 lb (101.6 kg), SpO2 98 %.  Gen. Pleasant, well-nourished, in no distress, normal affect  HEENT: Melbeta/AT, face symmetric, no scleral icterus, PERRLA, nares patent without drainage, pharynx with post nasal drainage, no erythema or exudate.  TMs full b/l. Lungs: no accessory muscle use, CTAB, no wheezes or rales Cardiovascular: RRR, no m/r/g, no peripheral edema Neuro:  A&Ox3, CN II-XII intact, normal gait Skin:  Warm, no lesions/ rash  Wt Readings from Last 3 Encounters:  12/11/18 224 lb (101.6 kg) (99 %, Z= 2.25)*  08/09/18 226 lb  (102.5 kg) (99 %, Z= 2.26)*  07/20/18 227 lb (103 kg) (99 %, Z= 2.27)*   * Growth percentiles are based on CDC (Girls, 2-20 Years) data.    No results found for: WBC, HGB, HCT, PLT, GLUCOSE, CHOL, TRIG, HDL, LDLDIRECT, LDLCALC, ALT, AST, NA, K, CL, CREATININE, BUN, CO2, TSH, PSA, INR, GLUF, HGBA1C, MICROALBUR  Assessment/Plan:  Seasonal allergies  -discussed symptoms -given samples of Zyrtec -advised to try OTC allergy med such as Allegra, Zyrtec, Claritin, Xyzal.  Also discussed option of using nasal spray such as Flonase, Nasacort, or saline nasal spray. -Given handouts -Follow-up PRN  Grier Mitts, MD

## 2019-08-28 ENCOUNTER — Other Ambulatory Visit: Payer: Self-pay

## 2019-08-28 DIAGNOSIS — Z20822 Contact with and (suspected) exposure to covid-19: Secondary | ICD-10-CM

## 2019-08-29 LAB — NOVEL CORONAVIRUS, NAA: SARS-CoV-2, NAA: NOT DETECTED

## 2019-10-01 ENCOUNTER — Other Ambulatory Visit: Payer: Self-pay

## 2019-10-01 DIAGNOSIS — Z20822 Contact with and (suspected) exposure to covid-19: Secondary | ICD-10-CM

## 2019-10-03 LAB — NOVEL CORONAVIRUS, NAA: SARS-CoV-2, NAA: NOT DETECTED

## 2019-10-03 LAB — SPECIMEN STATUS REPORT

## 2020-05-01 ENCOUNTER — Other Ambulatory Visit: Payer: Self-pay

## 2020-05-04 ENCOUNTER — Ambulatory Visit (INDEPENDENT_AMBULATORY_CARE_PROVIDER_SITE_OTHER): Payer: BC Managed Care – PPO | Admitting: Family Medicine

## 2020-05-04 ENCOUNTER — Encounter: Payer: Self-pay | Admitting: Family Medicine

## 2020-05-04 VITALS — BP 152/97 | HR 80 | Temp 97.8°F | Wt 221.0 lb

## 2020-05-04 DIAGNOSIS — Z131 Encounter for screening for diabetes mellitus: Secondary | ICD-10-CM | POA: Diagnosis not present

## 2020-05-04 DIAGNOSIS — Z0000 Encounter for general adult medical examination without abnormal findings: Secondary | ICD-10-CM | POA: Diagnosis not present

## 2020-05-04 DIAGNOSIS — R030 Elevated blood-pressure reading, without diagnosis of hypertension: Secondary | ICD-10-CM | POA: Diagnosis not present

## 2020-05-04 DIAGNOSIS — D369 Benign neoplasm, unspecified site: Secondary | ICD-10-CM | POA: Diagnosis not present

## 2020-05-04 DIAGNOSIS — Z23 Encounter for immunization: Secondary | ICD-10-CM | POA: Diagnosis not present

## 2020-05-04 DIAGNOSIS — Z13220 Encounter for screening for lipoid disorders: Secondary | ICD-10-CM

## 2020-05-04 DIAGNOSIS — Z113 Encounter for screening for infections with a predominantly sexual mode of transmission: Secondary | ICD-10-CM

## 2020-05-04 LAB — TSH: TSH: 0.64 u[IU]/mL (ref 0.35–5.50)

## 2020-05-04 LAB — CBC WITH DIFFERENTIAL/PLATELET
Basophils Absolute: 0 10*3/uL (ref 0.0–0.1)
Basophils Relative: 0.6 % (ref 0.0–3.0)
Eosinophils Absolute: 0.1 10*3/uL (ref 0.0–0.7)
Eosinophils Relative: 1.8 % (ref 0.0–5.0)
HCT: 29.3 % — ABNORMAL LOW (ref 36.0–46.0)
Hemoglobin: 9.4 g/dL — ABNORMAL LOW (ref 12.0–15.0)
Lymphocytes Relative: 21.5 % (ref 12.0–46.0)
Lymphs Abs: 1.5 10*3/uL (ref 0.7–4.0)
MCHC: 32.1 g/dL (ref 30.0–36.0)
MCV: 68.6 fl — ABNORMAL LOW (ref 78.0–100.0)
Monocytes Absolute: 0.5 10*3/uL (ref 0.1–1.0)
Monocytes Relative: 7 % (ref 3.0–12.0)
Neutro Abs: 4.7 10*3/uL (ref 1.4–7.7)
Neutrophils Relative %: 69.1 % (ref 43.0–77.0)
Platelets: 285 10*3/uL (ref 150.0–400.0)
RBC: 4.27 Mil/uL (ref 3.87–5.11)
RDW: 16.8 % — ABNORMAL HIGH (ref 11.5–14.6)
WBC: 6.8 10*3/uL (ref 4.5–10.5)

## 2020-05-04 LAB — LIPID PANEL
Cholesterol: 165 mg/dL (ref 0–200)
HDL: 80.1 mg/dL (ref >39.00)
LDL Cholesterol: 74 mg/dL (ref 0–99)
NonHDL: 85.15
Total CHOL/HDL Ratio: 2
Triglycerides: 55 mg/dL (ref 0.0–149.0)
VLDL: 11 mg/dL (ref 0.0–40.0)

## 2020-05-04 LAB — BASIC METABOLIC PANEL
BUN: 13 mg/dL (ref 6–23)
CO2: 24 mEq/L (ref 19–32)
Calcium: 9.3 mg/dL (ref 8.4–10.5)
Chloride: 107 mEq/L (ref 96–112)
Creatinine, Ser: 0.77 mg/dL (ref 0.40–1.20)
GFR: 114.94 mL/min (ref >60.00)
Glucose, Bld: 98 mg/dL (ref 70–99)
Potassium: 4.6 mEq/L (ref 3.5–5.1)
Sodium: 137 mEq/L (ref 135–145)

## 2020-05-04 LAB — T4, FREE: Free T4: 0.75 ng/dL (ref 0.60–1.60)

## 2020-05-04 LAB — HEMOGLOBIN A1C: Hgb A1c MFr Bld: 6 % (ref 4.6–6.5)

## 2020-05-04 NOTE — Patient Instructions (Addendum)
Preventive Care 72-21 Years Old, Female Preventive care refers to lifestyle choices and visits with your health care provider that can promote health and wellness. At this stage in your life, you may start seeing a primary care physician instead of a pediatrician. Your health care is now your responsibility. Preventive care for young adults includes:  A yearly physical exam. This is also called an annual wellness visit.  Regular dental and eye exams.  Immunizations.  Screening for certain conditions.  Healthy lifestyle choices, such as diet and exercise. What can I expect for my preventive care visit? Physical exam Your health care provider may check:  Height and weight. These may be used to calculate body mass index (BMI), which is a measurement that tells if you are at a healthy weight.  Heart rate and blood pressure.  Body temperature. Counseling Your health care provider may ask you questions about:  Past medical problems and family medical history.  Alcohol, tobacco, and drug use.  Home and relationship well-being.  Access to firearms.  Emotional well-being.  Diet, exercise, and sleep habits.  Sexual activity and sexual health.  Method of birth control.  Menstrual cycle.  Pregnancy history. What immunizations do I need?  Influenza (flu) vaccine  This is recommended every year. Tetanus, diphtheria, and pertussis (Tdap) vaccine  You may need a Td booster every 10 years. Varicella (chickenpox) vaccine  You may need this vaccine if you have not already been vaccinated. Human papillomavirus (HPV) vaccine  If recommended by your health care provider, you may need three doses over 6 months. Measles, mumps, and rubella (MMR) vaccine  You may need at least one dose of MMR. You may also need a second dose. Meningococcal conjugate (MenACWY) vaccine  One dose is recommended if you are 36-32 years old and a Market researcher living in a residence hall,  or if you have one of several medical conditions. You may also need additional booster doses. Pneumococcal conjugate (PCV13) vaccine  You may need this if you have certain conditions and were not previously vaccinated. Pneumococcal polysaccharide (PPSV23) vaccine  You may need one or two doses if you smoke cigarettes or if you have certain conditions. Hepatitis A vaccine  You may need this if you have certain conditions or if you travel or work in places where you may be exposed to hepatitis A. Hepatitis B vaccine  You may need this if you have certain conditions or if you travel or work in places where you may be exposed to hepatitis B. Haemophilus influenzae type b (Hib) vaccine  You may need this if you have certain risk factors. You may receive vaccines as individual doses or as more than one vaccine together in one shot (combination vaccines). Talk with your health care provider about the risks and benefits of combination vaccines. What tests do I need? Blood tests  Lipid and cholesterol levels. These may be checked every 5 years starting at age 40.  Hepatitis C test.  Hepatitis B test. Screening  Pelvic exam and Pap test. This may be done every 3 years starting at age 22.  Sexually transmitted disease (STD) testing, if you are at risk.  BRCA-related cancer screening. This may be done if you have a family history of breast, ovarian, tubal, or peritoneal cancers. Other tests  Tuberculosis skin test.  Vision and hearing tests.  Skin exam.  Breast exam. Follow these instructions at home: Eating and drinking   Eat a diet that includes fresh fruits and  vegetables, whole grains, lean protein, and low-fat dairy products.  Drink enough fluid to keep your urine pale yellow.  Do not drink alcohol if: ? Your health care provider tells you not to drink. ? You are pregnant, may be pregnant, or are planning to become pregnant. ? You are under the legal drinking age. In the  [place] the legal drinking age is 67.  If you drink alcohol: ? Limit how much you have to 0-1 drink a day. ? Be aware of how much alcohol is in your drink. In the [place] one drink equals one 12 oz bottle of beer (355 mL), one 5 oz glass of wine (148 mL), or one 1 oz glass of hard liquor (44 mL). Lifestyle  Take daily care of your teeth and gums.  Stay active. Exercise at least 30 minutes 5 or more days of the week.  Do not use any products that contain nicotine or tobacco, such as cigarettes, e-cigarettes, and chewing tobacco. If you need help quitting, ask your health care provider.  Do not use drugs.  If you are sexually active, practice safe sex. Use a condom or other form of birth control (contraception) in order to prevent pregnancy and STIs (sexually transmitted infections). If you plan to become pregnant, see your health care provider for a pre-conception visit.  Find healthy ways to cope with stress, such as: ? Meditation, yoga, or listening to music. ? Journaling. ? Talking to a trusted person. ? Spending time with friends and family. Safety  Always wear your seat belt while driving or riding in a vehicle.  Do not drive if you have been drinking alcohol. Do not ride with someone who has been drinking.  Do not drive when you are tired or distracted. Do not text while driving.  Wear a helmet and other protective equipment during sports activities.  If you have firearms in your house, make sure you follow all gun safety procedures.  Seek help if you have been bullied, physically abused, or sexually abused.  Use the Internet responsibly to avoid dangers such as online bullying and online sex predators. What's next?  Go to your health care provider once a year for a well check visit.  Ask your health care provider how often you should have your eyes and teeth checked.  Stay up to date on all vaccines. This information is not intended to replace advice given to you by  your health care provider. Make sure you discuss any questions you have with your health care provider. Document Revised: 12/06/2018 Document Reviewed: 12/06/2018 Elsevier Patient Education  [place].  Preventing Hypertension Hypertension, commonly called high blood pressure, is when the force of blood pumping through the arteries is too strong. Arteries are blood vessels that carry blood from the heart throughout the body. Over time, hypertension can damage the arteries and decrease blood flow to important parts of the body, including the brain, heart, and kidneys. Often, hypertension does not cause symptoms until blood pressure is very high. For this reason, it is important to have your blood pressure checked on a regular basis. Hypertension can often be prevented with diet and lifestyle changes. If you already have hypertension, you can control it with diet and lifestyle changes, as well as medicine. What nutrition changes can be made? Maintain a healthy diet. This includes:  Eating less salt (sodium). Ask your health care provider how much sodium is safe for you to have. The general recommendation is to consume less than  1 tsp (2,300 mg) of sodium a day. ? Do not add salt to your food. ? Choose low-sodium options when grocery shopping and eating out.  Limiting fats in your diet. You can do this by eating low-fat or fat-free dairy products and by eating less red meat.  Eating more fruits, vegetables, and whole grains. Make a goal to eat: ? 1-2 cups of fresh fruits and vegetables each day. ? 3-4 servings of whole grains each day.  Avoiding foods and beverages that have added sugars.  Eating fish that contain healthy fats (omega-3 fatty acids), such as mackerel or salmon. If you need help putting together a healthy eating plan, try the DASH diet. This diet is high in fruits, vegetables, and whole grains. It is low in sodium, red meat, and added sugars. DASH stands for Dietary  Approaches to Stop Hypertension. What lifestyle changes can be made?   Lose weight if you are overweight. Losing just 3?5% of your body weight can help prevent or control hypertension. ? For example, if your present weight is 200 lb (91 kg), a loss of 3-5% of your weight means losing 6-10 lb (2.7-4.5 kg). ? Ask your health care provider to help you with a diet and exercise plan to safely lose weight.  Get enough exercise. Do at least 150 minutes of moderate-intensity exercise each week. ? You could do this in short exercise sessions several times a day, or you could do longer exercise sessions a few times a week. For example, you could take a brisk 10-minute walk or bike ride, 3 times a day, for 5 days a week.  Find ways to reduce stress, such as exercising, meditating, listening to music, or taking a yoga class. If you need help reducing stress, ask your health care provider.  Do not smoke. This includes e-cigarettes. Chemicals in tobacco and nicotine products raise your blood pressure each time you smoke. If you need help quitting, ask your health care provider.  Avoid alcohol. If you drink alcohol, limit alcohol intake to no more than 1 drink a day for nonpregnant women and 2 drinks a day for men. One drink equals 12 oz of beer, 5 oz of wine, or 1 oz of hard liquor. Why are these changes important? Diet and lifestyle changes can help you prevent hypertension, and they may make you feel better overall and improve your quality of life. If you have hypertension, making these changes will help you control it and help prevent major complications, such as:  Hardening and narrowing of arteries that supply blood to: ? Your heart. This can cause a heart attack. ? Your brain. This can cause a stroke. ? Your kidneys. This can cause kidney failure.  Stress on your heart muscle, which can cause heart failure. What can I do to lower my risk?  Work with your health care provider to make a  hypertension prevention plan that works for you. Follow your plan and keep all follow-up visits as told by your health care provider.  Learn how to check your blood pressure at home. Make sure that you know your personal target blood pressure, as told by your health care provider. How is this treated? In addition to diet and lifestyle changes, your health care provider may recommend medicines to help lower your blood pressure. You may need to try a few different medicines to find what works best for you. You also may need to take more than one medicine. Take over-the-counter and prescription medicines only  as told by your health care provider. Where to find support Your health care provider can help you prevent hypertension and help you keep your blood pressure at a healthy level. Your local hospital or your community may also provide support services and prevention programs. The American Heart Association offers an online support network at: CheapBootlegs.com.cy Where to find more information Learn more about hypertension from:  [place], [facility], and Blood Institute: ElectronicHangman.is  Centers for Disease Control and Prevention: https://ingram.com/  American Academy of Family Physicians: http://familydoctor.org/familydoctor/en/diseases-conditions/high-blood-pressure.printerview.all.html Learn more about the DASH diet from:  [place], Lung, and [place]: https://www.reyes.com/ Contact a health care provider if:  You think you are having a reaction to medicines you have taken.  You have recurrent headaches or feel dizzy.  You have swelling in your ankles.  You have trouble with your vision. Summary  Hypertension often does not cause any symptoms until blood pressure is very high. It is important to get your blood pressure checked regularly.  Diet and lifestyle changes are  the most important steps in preventing hypertension.  By keeping your blood pressure in a healthy range, you can prevent complications like heart attack, heart failure, stroke, and kidney failure.  Work with your health care provider to make a hypertension prevention plan that works for you. This information is not intended to replace advice given to you by your health care provider. Make sure you discuss any questions you have with your health care provider. Document Revised: 04/05/2019 Document Reviewed: 08/22/2016 Elsevier Patient Education  [place], Adult Feeling a certain amount of stress is normal. Stress helps our body and mind get ready to deal with the demands of life. Stress hormones can motivate you to do well at work and meet your responsibilities. However severe or long-lasting (chronic) stress can affect your mental and physical health. Chronic stress puts you at higher risk for anxiety, depression, and other health problems like digestive problems, muscle aches, heart disease, high blood pressure, and stroke. What are the causes? Common causes of stress include:  Demands from work, such as deadlines, feeling overworked, or having long hours.  Pressures at home, such as money issues, disagreements with a spouse, or parenting issues.  Pressures from major life changes, such as divorce, moving, loss of a loved one, or chronic illness. You may be at higher risk for stress-related problems if you do not get enough sleep, are in poor health, do not have emotional support, or have a mental health disorder like anxiety or depression. How to recognize stress Stress can make you:  Have trouble sleeping.  Feel sad, anxious, irritable, or overwhelmed.  Lose your appetite.  Overeat or want to eat unhealthy foods.  Want to use drugs or alcohol. Stress can also cause physical symptoms, such as:  Sore, tense muscles, especially in the shoulders and  neck.  Headaches.  Trouble breathing.  A faster heart rate.  Stomach pain, nausea, or vomiting.  Diarrhea or constipation.  Trouble concentrating. Follow these instructions at home: Lifestyle  Identify the source of your stress and your reaction to it. See a therapist who can help you change your reactions.  When there are stressful events: ? Talk about it with family, friends, or co-workers. ? Try to think realistically about stressful events and not ignore them or overreact. ? Try to find the positives in a stressful situation and not focus on the negatives. ? Cut back on responsibilities at work and home, if possible.  Ask for help from friends or family members if you need it.  Find ways to cope with stress, such as: ? Meditation. ? Deep breathing. ? Yoga or tai chi. ? Progressive muscle relaxation. ? Doing art, playing music, or reading. ? Making time for fun activities. ? Spending time with family and friends.  Get support from family, friends, or spiritual resources. Eating and drinking  Eat a healthy diet. This includes: ? Eating foods that are high in fiber, such as beans, whole grains, and fresh fruits and vegetables. ? Limiting foods that are high in fat and processed sugars, such as fried and sweet foods.  Do not skip meals or overeat.  Drink enough fluid to keep your urine pale yellow. Alcohol use  Do not drink alcohol if: ? Your health care provider tells you not to drink. ? You are pregnant, may be pregnant, or are planning to become pregnant.  Drinking alcohol is a way some people try to ease their stress. This can be dangerous, so if you drink alcohol: ? Limit how much you use to:  0-1 drink a day for women.  0-2 drinks a day for men. ? Be aware of how much alcohol is in your drink. In the [place] one drink equals one 12 oz bottle of beer (355 mL), one 5 oz glass of wine (148 mL), or one 1 oz glass of hard liquor (44 mL). Activity   Include 30  minutes of exercise in your daily schedule. Exercise is a good stress reducer.  Include time in your day for an activity that you find relaxing. Try taking a walk, going on a bike ride, reading a book, or listening to music.  Schedule your time in a way that lowers stress, and keep a consistent schedule. Prioritize what is most important to get done. General instructions  Get enough sleep. Try to go to sleep and get up at about the same time every day.  Take over-the-counter and prescription medicines only as told by your health care provider.  Do not use any products that contain nicotine or tobacco, such as cigarettes, e-cigarettes, and chewing tobacco. If you need help quitting, ask your health care provider.  Do not use drugs or smoke to cope with stress.  Keep all follow-up visits as told by your health care provider. This is important. Where to find support  Talk with your health care provider about stress management or finding a support group.  Find a therapist to work with you on your stress management techniques. Contact a health care provider if:  Your stress symptoms get worse.  You are unable to manage your stress at home.  You are struggling to stop using drugs or alcohol. Get help right away if:  You may be a danger to yourself or others.  You have any thoughts of death or suicide. If you ever feel like you may hurt yourself or others, or have thoughts about taking your own life, get help right away. You can go to your nearest emergency department or call:  Your local emergency services (911 in the [place]  A suicide crisis helpline, such as the [place] at 228-394-2155. This is open 24 hours a day. Summary  Feeling a certain amount of stress is normal, but severe or long-lasting (chronic) stress can affect your mental and physical health.  Chronic stress can put you at higher risk for anxiety, depression, and other health problems  like digestive problems, muscle aches, heart  disease, high blood pressure, and stroke.  You may be at higher risk for stress-related problems if you do not get enough sleep, are in poor health, lack emotional support, or have a mental health disorder like anxiety or depression.  Identify the source of your stress and your reaction to it. Try talking about stressful events with family, friends, or co-workers, finding a coping method, or getting support from spiritual resources.  If you need more help, talk with your health care provider about finding a support group or a mental health therapist. This information is not intended to replace advice given to you by your health care provider. Make sure you discuss any questions you have with your health care provider. Document Revised: 07/10/2019 Document Reviewed: 07/10/2019 Elsevier Patient Education  [place] DASH stands for "Dietary Approaches to Stop Hypertension." The DASH eating plan is a healthy eating plan that has been shown to reduce high blood pressure (hypertension). It may also reduce your risk for type 2 diabetes, heart disease, and stroke. The DASH eating plan may also help with weight loss. What are tips for following this plan?  General guidelines  Avoid eating more than 2,300 mg (milligrams) of salt (sodium) a day. If you have hypertension, you may need to reduce your sodium intake to 1,500 mg a day.  Limit alcohol intake to no more than 1 drink a day for nonpregnant women and 2 drinks a day for men. One drink equals 12 oz of beer, 5 oz of wine, or 1 oz of hard liquor.  Work with your health care provider to maintain a healthy body weight or to lose weight. Ask what an ideal weight is for you.  Get at least 30 minutes of exercise that causes your heart to beat faster (aerobic exercise) most days of the week. Activities may include walking, swimming, or biking.  Work with your health care provider or  diet and nutrition specialist (dietitian) to adjust your eating plan to your individual calorie needs. Reading food labels   Check food labels for the amount of sodium per serving. Choose foods with less than 5 percent of the Daily Value of sodium. Generally, foods with less than 300 mg of sodium per serving fit into this eating plan.  To find whole grains, look for the word "whole" as the first word in the ingredient list. Shopping  Buy products labeled as "low-sodium" or "no salt added."  Buy fresh foods. Avoid canned foods and premade or frozen meals. Cooking  Avoid adding salt when cooking. Use salt-free seasonings or herbs instead of table salt or sea salt. Check with your health care provider or pharmacist before using salt substitutes.  Do not fry foods. Cook foods using healthy methods such as baking, boiling, grilling, and broiling instead.  Cook with heart-healthy oils, such as olive, canola, soybean, or sunflower oil. Meal planning  Eat a balanced diet that includes: ? 5 or more servings of fruits and vegetables each day. At each meal, try to fill half of your plate with fruits and vegetables. ? Up to 6-8 servings of whole grains each day. ? Less than 6 oz of lean meat, poultry, or fish each day. A 3-oz serving of meat is about the same size as a deck of cards. One egg equals 1 oz. ? 2 servings of low-fat dairy each day. ? A serving of nuts, seeds, or beans 5 times each week. ? Heart-healthy fats. Healthy fats called Omega-3 fatty  acids are found in foods such as flaxseeds and coldwater fish, like sardines, salmon, and mackerel.  Limit how much you eat of the following: ? Canned or prepackaged foods. ? Food that is high in trans fat, such as fried foods. ? Food that is high in saturated fat, such as fatty meat. ? Sweets, desserts, sugary drinks, and other foods with added sugar. ? Full-fat dairy products.  Do not salt foods before eating.  Try to eat at least 2  vegetarian meals each week.  Eat more home-cooked food and less restaurant, buffet, and fast food.  When eating at a restaurant, ask that your food be prepared with less salt or no salt, if possible. What foods are recommended? The items listed may not be a complete list. Talk with your dietitian about what dietary choices are best for you. Grains Whole-grain or whole-wheat bread. Whole-grain or whole-wheat pasta. Brown rice. [place]. Bulgur. Whole-grain and low-sodium cereals. Pita bread. Low-fat, low-sodium crackers. Whole-wheat flour tortillas. Vegetables Fresh or frozen vegetables (raw, steamed, roasted, or grilled). Low-sodium or reduced-sodium tomato and vegetable juice. Low-sodium or reduced-sodium tomato sauce and tomato paste. Low-sodium or reduced-sodium canned vegetables. Fruits All fresh, dried, or frozen fruit. Canned fruit in natural juice (without added sugar). Meat and other protein foods Skinless chicken or [place]. Ground chicken or [place]. Pork with fat trimmed off. Fish and seafood. Egg whites. Dried beans, peas, or lentils. Unsalted nuts, nut butters, and seeds. Unsalted canned beans. Lean cuts of beef with fat trimmed off. Low-sodium, lean deli meat. Dairy Low-fat (1%) or fat-free (skim) milk. Fat-free, low-fat, or reduced-fat cheeses. Nonfat, low-sodium ricotta or cottage cheese. Low-fat or nonfat yogurt. Low-fat, low-sodium cheese. Fats and oils Soft margarine without trans fats. Vegetable oil. Low-fat, reduced-fat, or light mayonnaise and salad dressings (reduced-sodium). Canola, safflower, olive, soybean, and sunflower oils. Avocado. Seasoning and other foods Herbs. Spices. Seasoning mixes without salt. Unsalted popcorn and pretzels. Fat-free sweets. What foods are not recommended? The items listed may not be a complete list. Talk with your dietitian about what dietary choices are best for you. Grains Baked goods made with fat, such as croissants, muffins, or  some breads. Dry pasta or rice meal packs. Vegetables Creamed or fried vegetables. Vegetables in a cheese sauce. Regular canned vegetables (not low-sodium or reduced-sodium). Regular canned tomato sauce and paste (not low-sodium or reduced-sodium). Regular tomato and vegetable juice (not low-sodium or reduced-sodium). Angie Fava. Olives. Fruits Canned fruit in a light or heavy syrup. Fried fruit. Fruit in cream or butter sauce. Meat and other protein foods Fatty cuts of meat. Ribs. Fried meat. Berniece Salines. Sausage. Bologna and other processed lunch meats. Salami. Fatback. Hotdogs. Bratwurst. Salted nuts and seeds. Canned beans with added salt. Canned or smoked fish. Whole eggs or egg yolks. Chicken or [place] with skin. Dairy Whole or 2% milk, cream, and half-and-half. Whole or full-fat cream cheese. Whole-fat or sweetened yogurt. Full-fat cheese. Nondairy creamers. Whipped toppings. Processed cheese and cheese spreads. Fats and oils Butter. Stick margarine. Lard. Shortening. Ghee. Bacon fat. Tropical oils, such as coconut, palm kernel, or palm oil. Seasoning and other foods Salted popcorn and pretzels. Onion salt, garlic salt, seasoned salt, table salt, and sea salt. Worcestershire sauce. Tartar sauce. Barbecue sauce. Teriyaki sauce. Soy sauce, including reduced-sodium. Steak sauce. Canned and packaged gravies. Fish sauce. Oyster sauce. Cocktail sauce. Horseradish that you find on the shelf. Ketchup. Mustard. Meat flavorings and tenderizers. Bouillon cubes. Hot sauce and Tabasco sauce. Premade or packaged marinades. Premade or packaged  taco seasonings. Relishes. Regular salad dressings. Where to find more information:  National Heart, Lung, and [place]: https://wilson-eaton.com/  American Heart Association: www.heart.org Summary  The DASH eating plan is a healthy eating plan that has been shown to reduce high blood pressure (hypertension). It may also reduce your risk for type 2 diabetes, heart disease,  and stroke.  With the DASH eating plan, you should limit salt (sodium) intake to 2,300 mg a day. If you have hypertension, you may need to reduce your sodium intake to 1,500 mg a day.  When on the DASH eating plan, aim to eat more fresh fruits and vegetables, whole grains, lean proteins, low-fat dairy, and heart-healthy fats.  Work with your health care provider or diet and nutrition specialist (dietitian) to adjust your eating plan to your individual calorie needs. This information is not intended to replace advice given to you by your health care provider. Make sure you discuss any questions you have with your health care provider. Document Revised: 11/24/2017 Document Reviewed: 12/05/2016 Elsevier Patient Education  2020 [place].  Epidermal Cyst  An epidermal cyst is a sac made of skin tissue. The sac contains a substance called keratin. Keratin is a protein that is normally secreted through the hair follicles. When keratin becomes trapped in the top layer of skin (epidermis), it can form an epidermal cyst. Epidermal cysts can be found anywhere on your body. These cysts are usually harmless (benign), and they may not cause symptoms unless they become infected. What are the causes? This condition may be caused by:  A blocked hair follicle.  A hair that curls and re-enters the skin instead of growing straight out of the skin (ingrown hair).  A blocked pore.  Irritated skin.  An injury to the skin.  Certain conditions that are passed along from parent to child (inherited).  Human papillomavirus (HPV).  Long-term (chronic) sun damage to the skin. What increases the risk? The following factors may make you more likely to develop an epidermal cyst:  Having acne.  Being overweight.  Being 76-90 years old. What are the signs or symptoms? The only symptom of this condition may be a small, painless lump underneath the skin. When an epidermal cyst ruptures, it may become  infected. Symptoms may include:  Redness.  Inflammation.  Tenderness.  Warmth.  Fever.  Keratin draining from the cyst. Keratin is grayish-white, bad-smelling substance.  Pus draining from the cyst. How is this diagnosed? This condition is diagnosed with a physical exam.  In some cases, you may have a sample of tissue (biopsy) taken from your cyst to be examined under a microscope or tested for bacteria.  You may be referred to a health care provider who specializes in skin care (dermatologist). How is this treated? In many cases, epidermal cysts go away on their own without treatment. If a cyst becomes infected, treatment may include:  Opening and draining the cyst, done by a health care provider. After draining, minor surgery to remove the rest of the cyst may be done.  Antibiotic medicine.  Injections of medicines (steroids) that help to reduce inflammation.  Surgery to remove the cyst. Surgery may be done if the cyst: ? Becomes large. ? Bothers you. ? Has a chance of turning into cancer.  Do not try to open a cyst yourself. Follow these instructions at home:  Take over-the-counter and prescription medicines only as told by your health care provider.  If you were prescribed an antibiotic medicine, take it  it as told by your health care provider. Do not stop using the antibiotic even if you start to feel better.  Keep the area around your cyst clean and dry.  Wear loose, dry clothing.  Avoid touching your cyst.  Check your cyst every day for signs of infection. Check for: ? Redness, swelling, or pain. ? Fluid or blood. ? Warmth. ? Pus or a bad smell.  Keep all follow-up visits as told by your health care provider. This is important. How is this prevented?  Wear clean, dry, clothing.  Avoid wearing tight clothing.  Keep your skin clean and dry. Take showers or baths every day. Contact a health care provider if:  Your cyst develops symptoms of  infection.  Your condition is not improving or is getting worse.  You develop a cyst that looks different from other cysts you have had.  You have a fever. Get help right away if:  Redness spreads from the cyst into the surrounding area. Summary  An epidermal cyst is a sac made of skin tissue. These cysts are usually harmless (benign), and they may not cause symptoms unless they become infected.  If a cyst becomes infected, treatment may include surgery to open and drain the cyst, or to remove it. Treatment may also include medicines by mouth or through an injection.  Take over-the-counter and prescription medicines only as told by your health care provider. If you were prescribed an antibiotic medicine, take it as told by your health care provider. Do not stop using the antibiotic even if you start to feel better.  Contact a health care provider if your condition is not improving or is getting worse.  Keep all follow-up visits as told by your health care provider. This is important. This information is not intended to replace advice given to you by your health care provider. Make sure you discuss any questions you have with your health care provider. Document Revised: 04/04/2019 Document Reviewed: 06/25/2018 Elsevier Patient Education  [place].

## 2020-05-04 NOTE — Progress Notes (Signed)
Subjective:     Carolyn Roman is a 19 y.o. female and is here for a comprehensive physical exam. The patient reports problems - bump in R armpit.  Started a few days ago, mildly tender.  Pt thought it was 2/2 her deodorant.  Pt recently back from college.  Pt attends [employer]. She is trying to find a PT to shadow while she is home.  Notes some stress thinking about that and working over the summer.  Pt otherwise doing well.  Denies HAs, changes in vision, chest pain, chest pressure, new medications.  Social History   Socioeconomic History  . Marital status: Single    Spouse name: n/a  . Number of children: 0  . Years of education: Not on file  . Highest education level: Not on file  Occupational History  . Occupation: Ship broker  Tobacco Use  . Smoking status: Never Smoker  . Smokeless tobacco: Never Used  Substance and Sexual Activity  . Alcohol use: No  . Drug use: No  . Sexual activity: Never  Other Topics Concern  . Not on file  Social History Narrative   Student [employer].  Plays Basketball and [place].  Hopes to be a doctor in the orthopedic field.      Lives with mom.   Social Determinants of Health   Financial Resource Strain:   . Difficulty of Paying Living Expenses:   Food Insecurity:   . Worried About Charity fundraiser in the Last Year:   . Arboriculturist in the Last Year:   Transportation Needs:   . Film/video editor (Medical):   Marland Kitchen Lack of Transportation (Non-Medical):   Physical Activity:   . Days of Exercise per Week:   . Minutes of Exercise per Session:   Stress:   . Feeling of Stress :   Social Connections:   . Frequency of Communication with Friends and Family:   . Frequency of Social Gatherings with Friends and Family:   . Attends Religious Services:   . Active Member of Clubs or Organizations:   . Attends Archivist Meetings:   Marland Kitchen Marital Status:   Intimate Partner Violence:   . Fear of Current or Ex-Partner:    . Emotionally Abused:   Marland Kitchen Physically Abused:   . Sexually Abused:    Health Maintenance  Topic Date Due  . HIV Screening  Never done  . TETANUS/TDAP  Never done  . INFLUENZA VACCINE  07/26/2020    The following portions of the patient's history were reviewed and updated as appropriate: allergies, current medications, past family history, past medical history, past social history, past surgical history and problem list.  Review of Systems Pertinent items noted in HPI and remainder of comprehensive ROS otherwise negative.   Objective:    BP (!) 152/97   Pulse 80   Temp 97.8 F (36.6 C) (Temporal)   Wt 221 lb (100.2 kg)   LMP 05/03/2020 (Exact Date)   SpO2 98%   BMI 29.16 kg/m  General appearance: alert, cooperative and no distress Head: Normocephalic, without obvious abnormality, atraumatic Eyes: conjunctivae/corneas clear. PERRL, EOM's intact. Fundi benign. Ears: normal TM's and external ear canals both ears Nose: Nares normal. Septum midline. Mucosa normal. No drainage or sinus tenderness. Throat: lips, mucosa, and tongue normal; teeth and gums normal Neck: no adenopathy, no carotid bruit, no JVD, supple, symmetrical, trachea midline and thyroid not enlarged, symmetric, no tenderness/mass/nodules Lungs: clear to auscultation bilaterally Heart: regular rate  and rhythm, S1, S2 normal, no murmur, click, rub or gallop Abdomen: soft, non-tender; bowel sounds normal; no masses,  no organomegaly Extremities: extremities normal, atraumatic, no cyanosis or edema Pulses: 2+ and symmetric Skin: Skin color, texture, turgor normal. No rashes.  R axilla with small round,smooth, 1 cm, cyst.  Mildly TTP, no erythema, or induration. Lymph nodes: Cervical, supraclavicular, and axillary nodes normal. Neurologic: Alert and oriented X 3, normal strength and tone. Normal symmetric reflexes. Normal coordination and gait    Assessment:    Healthy female exam with elevated bp and cyst in R  axilla.     Plan:     Anticipatory guidance given including wearing seatbelts, smoke detectors in the home, increasing physical activity, increasing p.o. intake of water and vegetables. -will obtain lab -pap with OB/gyn in Aug/Sept. -given handouts -next CPE in 1 yr. See After Visit Summary for Counseling Recommendations    Need for diphtheria-tetanus-pertussis (Tdap) vaccine  - Plan: Tdap vaccine greater than or equal to 7yo IM  Elevated blood pressure reading  -baseline 110s-120/70s-80 -recheck bp.  Initial reading 160/110, then 152/97 -PHQ-9 score 2 -GAD-7 score 4 -discussed lifestyle modifications -pt to check bp at home.  For continued elevation >140/90 pt to notify clinic. -given handouts -close f/u stressed, RTC in 2-4 wks, sooner if needed. - Plan: Basic metabolic panel, TSH, T4, Free  Routine screening for STI (sexually transmitted infection)  - Plan: RPR, HIV antibody (with reflex)  Screening for cholesterol level  - Plan: Lipid panel  Screening for diabetes mellitus  - Plan: Hemoglobin A1c  Dermoid cyst -Discussed supportive care -For continued or worsening symptoms this week discussed I&D. -given handout -f/u this wk if needed  F/u in the next few days for cysts and 1 month for elevated BP, sooner if needed.  Grier Mitts, MD

## 2020-05-05 ENCOUNTER — Other Ambulatory Visit: Payer: Self-pay | Admitting: Family Medicine

## 2020-05-05 DIAGNOSIS — D649 Anemia, unspecified: Secondary | ICD-10-CM

## 2020-05-05 LAB — HIV ANTIBODY (ROUTINE TESTING W REFLEX): HIV 1&2 Ab, 4th Generation: NONREACTIVE

## 2020-05-05 LAB — RPR: RPR Ser Ql: NONREACTIVE

## 2020-05-05 MED ORDER — IRON (FERROUS SULFATE) 325 (65 FE) MG PO TABS: 1.0000 | ORAL_TABLET | Freq: Every day | ORAL | 3 refills | Status: AC

## 2020-05-08 ENCOUNTER — Other Ambulatory Visit: Payer: Self-pay

## 2020-05-08 DIAGNOSIS — D509 Iron deficiency anemia, unspecified: Secondary | ICD-10-CM

## 2020-05-11 ENCOUNTER — Other Ambulatory Visit: Payer: Self-pay

## 2020-05-12 ENCOUNTER — Other Ambulatory Visit (INDEPENDENT_AMBULATORY_CARE_PROVIDER_SITE_OTHER): Payer: BC Managed Care – PPO

## 2020-05-12 DIAGNOSIS — D649 Anemia, unspecified: Secondary | ICD-10-CM

## 2020-05-12 LAB — CBC WITH DIFFERENTIAL/PLATELET
Basophils Absolute: 0 10*3/uL (ref 0.0–0.1)
Basophils Relative: 0.8 % (ref 0.0–3.0)
Eosinophils Absolute: 0.2 10*3/uL (ref 0.0–0.7)
Eosinophils Relative: 3.3 % (ref 0.0–5.0)
HCT: 30.2 % — ABNORMAL LOW (ref 36.0–46.0)
Hemoglobin: 9.8 g/dL — ABNORMAL LOW (ref 12.0–15.0)
Lymphocytes Relative: 31.5 % (ref 12.0–46.0)
Lymphs Abs: 1.6 10*3/uL (ref 0.7–4.0)
MCHC: 32.4 g/dL (ref 30.0–36.0)
MCV: 68 fl — ABNORMAL LOW (ref 78.0–100.0)
Monocytes Absolute: 0.4 10*3/uL (ref 0.1–1.0)
Monocytes Relative: 7.2 % (ref 3.0–12.0)
Neutro Abs: 3 10*3/uL (ref 1.4–7.7)
Neutrophils Relative %: 57.2 % (ref 43.0–77.0)
Platelets: 278 10*3/uL (ref 150.0–400.0)
RBC: 4.44 Mil/uL (ref 3.87–5.11)
RDW: 17.3 % — ABNORMAL HIGH (ref 11.5–14.6)
WBC: 5.2 10*3/uL (ref 4.5–10.5)

## 2020-05-12 LAB — IBC + FERRITIN
Ferritin: 9.2 ng/mL — ABNORMAL LOW (ref 10.0–291.0)
Iron: 35 ug/dL — ABNORMAL LOW (ref 42–145)
Saturation Ratios: 7.3 % — ABNORMAL LOW (ref 20.0–50.0)
Transferrin: 341 mg/dL (ref 212.0–360.0)

## 2020-05-12 LAB — FERRITIN: Ferritin: 9.2 ng/mL — ABNORMAL LOW (ref 10.0–291.0)

## 2021-03-31 ENCOUNTER — Encounter: Payer: Self-pay | Admitting: Family Medicine

## 2021-05-18 ENCOUNTER — Encounter: Payer: Self-pay | Admitting: Family Medicine

## 2021-08-25 ENCOUNTER — Encounter: Payer: Self-pay | Admitting: Family Medicine

## 2021-08-25 ENCOUNTER — Telehealth (INDEPENDENT_AMBULATORY_CARE_PROVIDER_SITE_OTHER): Payer: BC Managed Care – PPO | Admitting: Family Medicine

## 2021-08-25 DIAGNOSIS — U071 COVID-19: Secondary | ICD-10-CM

## 2021-08-25 MED ORDER — ALBUTEROL SULFATE HFA 108 (90 BASE) MCG/ACT IN AERS
2.0000 | INHALATION_SPRAY | Freq: Four times a day (QID) | RESPIRATORY_TRACT | 0 refills | Status: DC | PRN
Start: 2021-08-25 — End: 2021-09-20

## 2021-08-25 MED ORDER — BENZONATATE 100 MG PO CAPS: 100.0000 mg | ORAL_CAPSULE | Freq: Two times a day (BID) | ORAL | 0 refills | Status: AC | PRN

## 2021-08-25 NOTE — Progress Notes (Signed)
Virtual Visit via Telephone Note  I connected with Carolyn Roman on 08/25/21 at  4:00 PM EDT by telephone and verified that I am speaking with the correct person using two identifiers.   I discussed the limitations, risks, security and privacy concerns of performing an evaluation and management service by telephone and the availability of in person appointments. I also discussed with the patient that there may be a patient responsible charge related to this service. The patient expressed understanding and agreed to proceed.  Location patient: home Location provider: work or home office Participants present for the call: patient, provider Patient did not have a visit in the prior 7 days to address this/these issue(s).   History of Present Illness: Pt contracted COVID last wk.  Symptoms started Tuesday afternoon 8/23 with positive test earlier that am. Notes improvement in symptoms however, still unable to taste or smell and has a cough.   Unsure if having any wheezing.     Pt had 2 J&J COVID vaccines and 1 Moderna booster.   Observations/Objective: Patient sounds cheerful and well on the phone. I do not appreciate any SOB. Speech and thought processing are grossly intact. Patient reported vitals:  Assessment and Plan: COVID-19 virus infection  -Resolving -Symptoms and positive test on 08/17/2021 -Continue supportive care including rest, hydration, OTC cough/cold medications -Given questionable wheezing and occasional SOB will start albuterol inhaler. -Given strict precautions - Plan: benzonatate (TESSALON) 100 MG capsule, albuterol (VENTOLIN HFA) 108 (90 Base) MCG/ACT inhaler   Follow Up Instructions:  Follow-up as needed   [id number] [id number] [id number] 11-20 [id number] 21-30 I did not refer this patient for an OV in the next 24 hours for this/these issue(s).  I discussed the assessment and treatment plan with the patient. The patient was provided an opportunity to ask questions and  all were answered. The patient agreed with the plan and demonstrated an understanding of the instructions.   The patient was advised to call back or seek an in-person evaluation if the symptoms worsen or if the condition fails to improve as anticipated.  I provided 10 minutes of non-face-to-face time during this encounter.   Billie Ruddy, MD

## 2021-09-19 ENCOUNTER — Other Ambulatory Visit: Payer: Self-pay | Admitting: Family Medicine

## 2021-09-19 DIAGNOSIS — U071 COVID-19: Secondary | ICD-10-CM

## 2021-10-01 ENCOUNTER — Encounter: Payer: Self-pay | Admitting: Family Medicine

## 2021-11-11 ENCOUNTER — Encounter: Payer: Self-pay | Admitting: Family Medicine

## 2022-05-20 ENCOUNTER — Encounter: Payer: Self-pay | Admitting: Family Medicine

## 2022-05-20 ENCOUNTER — Ambulatory Visit (INDEPENDENT_AMBULATORY_CARE_PROVIDER_SITE_OTHER): Payer: BC Managed Care – PPO | Admitting: Family Medicine

## 2022-05-20 VITALS — BP 102/80 | HR 69 | Temp 98.3°F | Ht 73.0 in | Wt 230.0 lb

## 2022-05-20 DIAGNOSIS — Z862 Personal history of diseases of the blood and blood-forming organs and certain disorders involving the immune mechanism: Secondary | ICD-10-CM | POA: Diagnosis not present

## 2022-05-20 DIAGNOSIS — Z0001 Encounter for general adult medical examination with abnormal findings: Secondary | ICD-10-CM

## 2022-05-20 DIAGNOSIS — Z0189 Encounter for other specified special examinations: Secondary | ICD-10-CM

## 2022-05-20 DIAGNOSIS — L308 Other specified dermatitis: Secondary | ICD-10-CM | POA: Diagnosis not present

## 2022-05-20 DIAGNOSIS — Z0000 Encounter for general adult medical examination without abnormal findings: Secondary | ICD-10-CM

## 2022-05-20 DIAGNOSIS — Z1159 Encounter for screening for other viral diseases: Secondary | ICD-10-CM | POA: Diagnosis not present

## 2022-05-20 LAB — CBC WITH DIFFERENTIAL/PLATELET
Basophils Absolute: 0 10*3/uL (ref 0.0–0.1)
Basophils Relative: 0.8 % (ref 0.0–3.0)
Eosinophils Absolute: 0.2 10*3/uL (ref 0.0–0.7)
Eosinophils Relative: 3.1 % (ref 0.0–5.0)
HCT: 34.2 % — ABNORMAL LOW (ref 36.0–46.0)
Hemoglobin: 11.5 g/dL — ABNORMAL LOW (ref 12.0–15.0)
Lymphocytes Relative: 32.2 % (ref 12.0–46.0)
Lymphs Abs: 1.9 10*3/uL (ref 0.7–4.0)
MCHC: 33.5 g/dL (ref 30.0–36.0)
MCV: 78.4 fl (ref 78.0–100.0)
Monocytes Absolute: 0.3 10*3/uL (ref 0.1–1.0)
Monocytes Relative: 5.6 % (ref 3.0–12.0)
Neutro Abs: 3.3 10*3/uL (ref 1.4–7.7)
Neutrophils Relative %: 58.3 % (ref 43.0–77.0)
Platelets: 270 10*3/uL (ref 150.0–400.0)
RBC: 4.36 Mil/uL (ref 3.87–5.11)
RDW: 15.4 % (ref 11.5–15.5)
WBC: 5.7 10*3/uL (ref 4.0–10.5)

## 2022-05-20 LAB — TSH: TSH: 1.22 u[IU]/mL (ref 0.35–5.50)

## 2022-05-20 LAB — LIPID PANEL
Cholesterol: 155 mg/dL (ref 0–200)
HDL: 79.6 mg/dL (ref >39.00)
LDL Cholesterol: 69 mg/dL (ref 0–99)
NonHDL: 75.73
Total CHOL/HDL Ratio: 2
Triglycerides: 33 mg/dL (ref 0.0–149.0)
VLDL: 6.6 mg/dL (ref 0.0–40.0)

## 2022-05-20 LAB — BASIC METABOLIC PANEL
BUN: 10 mg/dL (ref 6–23)
CO2: 26 mEq/L (ref 19–32)
Calcium: 10 mg/dL (ref 8.4–10.5)
Chloride: 108 mEq/L (ref 96–112)
Creatinine, Ser: 0.94 mg/dL (ref 0.40–1.20)
GFR: 86.05 mL/min (ref >60.00)
Glucose, Bld: 95 mg/dL (ref 70–99)
Potassium: 4.7 mEq/L (ref 3.5–5.1)
Sodium: 141 mEq/L (ref 135–145)

## 2022-05-20 LAB — HEMOGLOBIN A1C: Hgb A1c MFr Bld: 5.6 % (ref 4.6–6.5)

## 2022-05-20 LAB — T4, FREE: Free T4: 0.74 ng/dL (ref 0.60–1.60)

## 2022-05-20 NOTE — Patient Instructions (Signed)
It appears that you did have 3 doses of the HPV vaccine Gardasil in 2014?  At your pediatrician office.

## 2022-05-20 NOTE — Progress Notes (Signed)
Subjective:     Carolyn Roman is a 21 y.o. female and is here for a comprehensive physical exam. The patient reports intermittent rash on dorsum of bilateral hands, worse in the summer.  Skin typically pruritic rash appears.  Patient endorses recent follow-up with OB/GYN for pap and colpo for abnormal pap.  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  Pt endorses a positive culture from a lesion. No other outbreaks reported.  Pt inquires about blood type and sickle cell testing.  Social History   Socioeconomic History   Marital status: Single    Spouse name: n/a   Number of children: 0   Years of education: Not on file   Highest education level: Not on file  Occupational History   Occupation: student  Tobacco Use   Smoking status: Never   Smokeless tobacco: Never  Vaping Use   Vaping Use: Never used  Substance and Sexual Activity   Alcohol use: Yes    Alcohol/week: 2.0 standard drinks    Types: 1 Glasses of wine, 1 Shots of liquor per week    Comment: ocassionally   Drug use: No   Sexual activity: Never  Other Topics Concern   Not on file  Social History Narrative   Student [employer].  Plays Basketball and [place].  Hopes to be a doctor in the orthopedic field.      Lives with mom.   Social Determinants of Health   Financial Resource Strain: Not on file  Food Insecurity: Not on file  Transportation Needs: Not on file  Physical Activity: Not on file  Stress: Not on file  Social Connections: Not on file  Intimate Partner Violence: Not on file   Health Maintenance  Topic Date Due   HPV VACCINES (1 - 2-dose series) Never done   Hepatitis C Screening  Never done   PAP-Cervical Cytology Screening  Never done   PAP SMEAR-Modifier  Never done   INFLUENZA VACCINE  07/26/2022   TETANUS/TDAP  03/18/2031   HIV Screening  Completed    The following portions of the patient's history were reviewed and updated as appropriate: allergies, current medications, past family  history, past medical history, past social history, past surgical history, and problem list.  Review of Systems Pertinent items noted in HPI and remainder of comprehensive ROS otherwise negative.   Objective:    BP 102/80   Pulse 69   Temp 98.3 F (36.8 C) (Oral)   Ht '6\' 1"'$  (1.854 m)   Wt 230 lb (104.3 kg)   LMP 05/12/2022   SpO2 98%   BMI 30.34 kg/m  General appearance: alert, cooperative, and no distress Head: Normocephalic, without obvious abnormality, atraumatic Eyes: conjunctivae/corneas clear. PERRL, EOM's intact. Fundi benign. Ears: normal TM's and external ear canals both ears Nose: Nares normal. Septum midline. Mucosa normal. No drainage or sinus tenderness. Throat: lips, mucosa, and tongue normal; teeth and gums normal Neck: no adenopathy, no carotid bruit, no JVD, supple, symmetrical, trachea midline, and thyroid not enlarged, symmetric, no tenderness/mass/nodules Lungs: clear to auscultation bilaterally Heart: regular rate and rhythm, S1, S2 normal, no murmur, click, rub or gallop Abdomen: soft, non-tender; bowel sounds normal; no masses,  no organomegaly Extremities: extremities normal, atraumatic, no cyanosis or edema Pulses: 2+ and symmetric Skin: Skin color, texture, turgor normal. No rashes or lesions Lymph nodes: Cervical, supraclavicular, and axillary nodes normal. Neurologic: Alert and oriented X 3, normal strength and tone. Normal symmetric reflexes. Normal coordination and gait    Assessment:  Healthy female exam.      Plan:    Anticipatory guidance given including wearing seatbelts, smoke detectors in the home, increasing physical activity, increasing p.o. intake of water and vegetables. -obtain labs -Immunizations reviewed.  Patient did have 3 doses of HPV vaccine in 2014 at [facility] office per [place]. 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  Continue follow-up with OB/GYN. -Mammogram and colonoscopy not yet indicated 2/2  age -Given handout -next CPE in 1 yr See After Visit Summary for Counseling Recommendations  - Plan: Basic metabolic panel, TSH, T4, Free, Hemoglobin A1c, Lipid panel  History of anemia  -Hgb 9.8, Hct 30.2, Iron 35, Ferritin 9.2 on 05/12/20. -increase po intake of iron rich foods, consider daily iron. - Plan: CBC with Differential/Platelet  Patient request for diagnostic testing  -pt requesting blood type and sickle cell screen. -advised if born in the state of [place] screen would have been done as a part of newborn screen. - Plan: ABO, Sickle Cell Screen  Other eczema -discussed ways to decrease symptoms including showering with warm water as opposed to hot water, moisturizing skin frequently throughout the day as needed -topical steroid prn -Given handout  Encounter for hepatitis C screening test for low risk patient  - Plan: Hep C Antibody  F/u prn  Grier Mitts, MD

## 2022-05-21 LAB — ABO

## 2022-05-24 LAB — HEPATITIS C ANTIBODY
Hepatitis C Ab: NONREACTIVE
SIGNAL TO CUT-OFF: 0.07 (ref <1.00)

## 2022-05-24 LAB — SICKLE CELL SCREEN: Sickle Solubility Test - HGBRFX: NEGATIVE

## 2022-07-01 ENCOUNTER — Encounter: Payer: BC Managed Care – PPO | Admitting: Family Medicine

## 2022-10-24 ENCOUNTER — Encounter: Payer: Self-pay | Admitting: Family Medicine

## 2023-05-09 ENCOUNTER — Encounter: Payer: Self-pay | Admitting: Family Medicine

## 2023-05-09 LAB — HM PAP SMEAR
